# Patient Record
Sex: Female | Born: 1955
Health system: Southern US, Community
[De-identification: ages and names within clinical notes are randomized; demographics above are authoritative.]

## PROBLEM LIST (undated history)

## (undated) DIAGNOSIS — M1990 Unspecified osteoarthritis, unspecified site: Secondary | ICD-10-CM

## (undated) DIAGNOSIS — F329 Major depressive disorder, single episode, unspecified: Secondary | ICD-10-CM

## (undated) DIAGNOSIS — F32A Depression, unspecified: Secondary | ICD-10-CM

## (undated) DIAGNOSIS — B019 Varicella without complication: Secondary | ICD-10-CM

## (undated) DIAGNOSIS — E079 Disorder of thyroid, unspecified: Secondary | ICD-10-CM

## (undated) DIAGNOSIS — N390 Urinary tract infection, site not specified: Secondary | ICD-10-CM

## (undated) DIAGNOSIS — K589 Irritable bowel syndrome without diarrhea: Secondary | ICD-10-CM

## (undated) DIAGNOSIS — G459 Transient cerebral ischemic attack, unspecified: Secondary | ICD-10-CM

## (undated) DIAGNOSIS — J42 Unspecified chronic bronchitis: Secondary | ICD-10-CM

## (undated) DIAGNOSIS — K219 Gastro-esophageal reflux disease without esophagitis: Secondary | ICD-10-CM

## (undated) HISTORY — PX: ABDOMINAL HYSTERECTOMY: SHX81

## (undated) HISTORY — DX: Irritable bowel syndrome, unspecified: K58.9

## (undated) HISTORY — DX: Depression, unspecified: F32.A

## (undated) HISTORY — DX: Gastro-esophageal reflux disease without esophagitis: K21.9

## (undated) HISTORY — DX: Unspecified osteoarthritis, unspecified site: M19.90

## (undated) HISTORY — DX: Urinary tract infection, site not specified: N39.0

## (undated) HISTORY — DX: Major depressive disorder, single episode, unspecified: F32.9

## (undated) HISTORY — DX: Unspecified chronic bronchitis: J42

## (undated) HISTORY — DX: Disorder of thyroid, unspecified: E07.9

## (undated) HISTORY — DX: Transient cerebral ischemic attack, unspecified: G45.9

## (undated) HISTORY — DX: Varicella without complication: B01.9

## (undated) HISTORY — PX: APPENDECTOMY: SHX54

---

## 1990-02-19 HISTORY — PX: BREAST BIOPSY: SHX20

## 2005-03-07 ENCOUNTER — Ambulatory Visit: Payer: Self-pay

## 2013-12-23 ENCOUNTER — Emergency Department: Payer: Self-pay | Admitting: Emergency Medicine

## 2013-12-23 LAB — COMPREHENSIVE METABOLIC PANEL
ALT: 20 U/L
Albumin: 4.3 g/dL (ref 3.4–5.0)
Alkaline Phosphatase: 100 U/L
Anion Gap: 11 (ref 7–16)
BUN: 20 mg/dL — ABNORMAL HIGH (ref 7–18)
Bilirubin,Total: 0.5 mg/dL (ref 0.2–1.0)
CHLORIDE: 103 mmol/L (ref 98–107)
CREATININE: 0.79 mg/dL (ref 0.60–1.30)
Calcium, Total: 9.5 mg/dL (ref 8.5–10.1)
Co2: 25 mmol/L (ref 21–32)
EGFR (African American): >60
EGFR (Non-African Amer.): >60
GLUCOSE: 96 mg/dL (ref 65–99)
OSMOLALITY: 280 (ref 275–301)
Potassium: 3.3 mmol/L — ABNORMAL LOW (ref 3.5–5.1)
SGOT(AST): 30 U/L (ref 15–37)
Sodium: 139 mmol/L (ref 136–145)
TOTAL PROTEIN: 8.7 g/dL — AB (ref 6.4–8.2)

## 2013-12-23 LAB — CBC
HCT: 44.7 % (ref 35.0–47.0)
HGB: 14.8 g/dL (ref 12.0–16.0)
MCH: 29.5 pg (ref 26.0–34.0)
MCHC: 33 g/dL (ref 32.0–36.0)
MCV: 89 fL (ref 80–100)
Platelet: 477 10*3/uL — ABNORMAL HIGH (ref 150–440)
RBC: 5.01 10*6/uL (ref 3.80–5.20)
RDW: 14.3 % (ref 11.5–14.5)
WBC: 16.1 10*3/uL — ABNORMAL HIGH (ref 3.6–11.0)

## 2013-12-23 LAB — URINALYSIS, COMPLETE
BILIRUBIN, UR: NEGATIVE
Glucose,UR: NEGATIVE mg/dL (ref 0–75)
Leukocyte Esterase: NEGATIVE
NITRITE: NEGATIVE
PH: 5 (ref 4.5–8.0)
PROTEIN: NEGATIVE
SPECIFIC GRAVITY: 1.024 (ref 1.003–1.030)
Squamous Epithelial: 3
WBC UR: 4 /HPF (ref 0–5)

## 2013-12-23 LAB — TROPONIN I: Troponin-I: <0.02 ng/mL

## 2013-12-25 ENCOUNTER — Emergency Department: Payer: Self-pay | Admitting: Emergency Medicine

## 2013-12-25 LAB — COMPREHENSIVE METABOLIC PANEL
ALBUMIN: 4.1 g/dL (ref 3.4–5.0)
ALK PHOS: 88 U/L
AST: 29 U/L (ref 15–37)
Anion Gap: 10 (ref 7–16)
BILIRUBIN TOTAL: 0.4 mg/dL (ref 0.2–1.0)
BUN: 7 mg/dL (ref 7–18)
Calcium, Total: 8.9 mg/dL (ref 8.5–10.1)
Chloride: 101 mmol/L (ref 98–107)
Co2: 28 mmol/L (ref 21–32)
Creatinine: 0.74 mg/dL (ref 0.60–1.30)
EGFR (Non-African Amer.): >60
Glucose: 89 mg/dL (ref 65–99)
OSMOLALITY: 275 (ref 275–301)
Potassium: 3.2 mmol/L — ABNORMAL LOW (ref 3.5–5.1)
SGPT (ALT): 21 U/L
SODIUM: 139 mmol/L (ref 136–145)
Total Protein: 8 g/dL (ref 6.4–8.2)

## 2013-12-25 LAB — DRUG SCREEN, URINE
Amphetamines, Ur Screen: NEGATIVE (ref ?–1000)
Barbiturates, Ur Screen: NEGATIVE (ref ?–200)
Benzodiazepine, Ur Scrn: NEGATIVE (ref ?–200)
COCAINE METABOLITE, UR ~~LOC~~: NEGATIVE (ref ?–300)
Cannabinoid 50 Ng, Ur ~~LOC~~: NEGATIVE (ref ?–50)
MDMA (ECSTASY) UR SCREEN: NEGATIVE (ref ?–500)
Methadone, Ur Screen: NEGATIVE (ref ?–300)
Opiate, Ur Screen: POSITIVE (ref ?–300)
Phencyclidine (PCP) Ur S: NEGATIVE (ref ?–25)
TRICYCLIC, UR SCREEN: POSITIVE (ref ?–1000)

## 2013-12-25 LAB — URINALYSIS, COMPLETE
BACTERIA: NONE SEEN
Bilirubin,UR: NEGATIVE
Glucose,UR: NEGATIVE mg/dL (ref 0–75)
Hyaline Cast: 6
LEUKOCYTE ESTERASE: NEGATIVE
Nitrite: NEGATIVE
PH: 5 (ref 4.5–8.0)
PROTEIN: NEGATIVE
Specific Gravity: 1.017 (ref 1.003–1.030)

## 2013-12-25 LAB — CBC WITH DIFFERENTIAL/PLATELET
BASOS ABS: 0.1 10*3/uL (ref 0.0–0.1)
Basophil %: 0.9 %
EOS PCT: 0.8 %
Eosinophil #: 0.1 10*3/uL (ref 0.0–0.7)
HCT: 40.2 % (ref 35.0–47.0)
HGB: 13.2 g/dL (ref 12.0–16.0)
Lymphocyte #: 3.5 10*3/uL (ref 1.0–3.6)
Lymphocyte %: 32.9 %
MCH: 30 pg (ref 26.0–34.0)
MCHC: 32.8 g/dL (ref 32.0–36.0)
MCV: 91 fL (ref 80–100)
MONO ABS: 0.7 x10 3/mm (ref 0.2–0.9)
MONOS PCT: 6.7 %
Neutrophil #: 6.2 10*3/uL (ref 1.4–6.5)
Neutrophil %: 58.7 %
Platelet: 452 10*3/uL — ABNORMAL HIGH (ref 150–440)
RBC: 4.4 10*6/uL (ref 3.80–5.20)
RDW: 14.4 % (ref 11.5–14.5)
WBC: 10.6 10*3/uL (ref 3.6–11.0)

## 2013-12-25 LAB — AMMONIA: Ammonia, Plasma: <10 mcmol/L (ref 11–32)

## 2013-12-25 LAB — ACETAMINOPHEN LEVEL: Acetaminophen: <2 ug/mL

## 2013-12-25 LAB — TSH: Thyroid Stimulating Horm: 1.04 u[IU]/mL

## 2013-12-25 LAB — SALICYLATE LEVEL: Salicylates, Serum: 4.5 mg/dL — ABNORMAL HIGH

## 2013-12-25 LAB — LIPASE, BLOOD: Lipase: 111 U/L (ref 73–393)

## 2013-12-28 LAB — CULTURE, BLOOD (SINGLE)

## 2014-04-05 ENCOUNTER — Emergency Department: Payer: Self-pay | Admitting: Emergency Medicine

## 2016-01-01 IMAGING — CT CT HEAD WITHOUT CONTRAST
1 series · 16 of 30 positions shown, 20 images · non-contrast
Comparison: None.

CLINICAL DATA: Altered mental status on going for 4 days.

EXAM:
CT HEAD WITHOUT CONTRAST
TECHNIQUE: Contiguous axial images were obtained from the base of the skull
through the vertex without intravenous contrast.

[Series 2: head wo · axial · 0.40mm/px · z∈[-74,+66]mm · 16 of 30 slices shown, 20 images]
[im 2/30  brain]
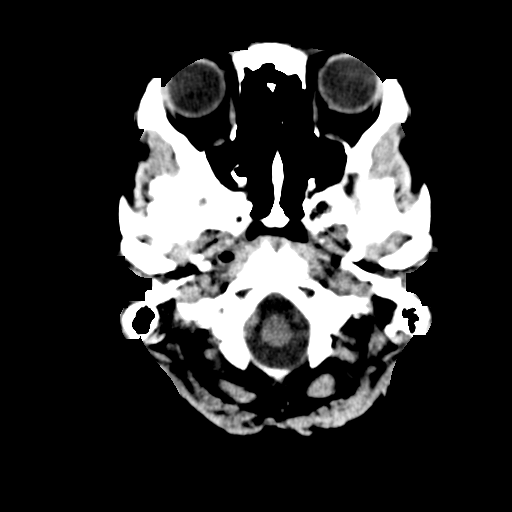
[im 2/30  bone]
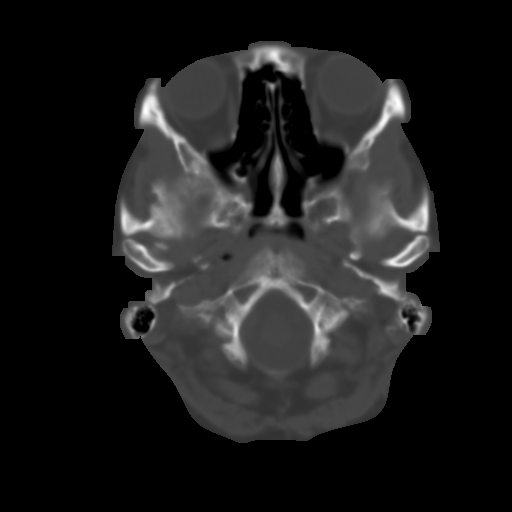
[im 4/30  brain]
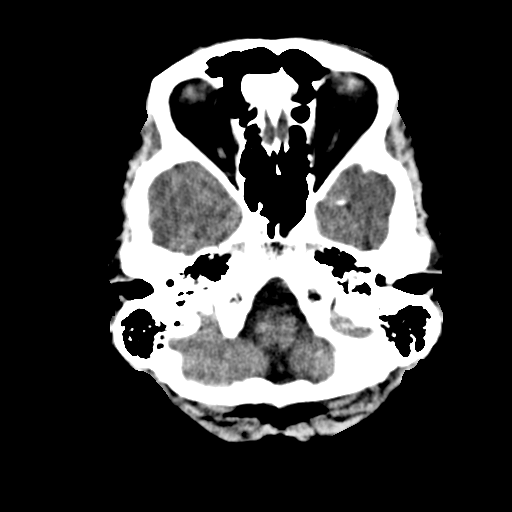
[im 6/30  brain]
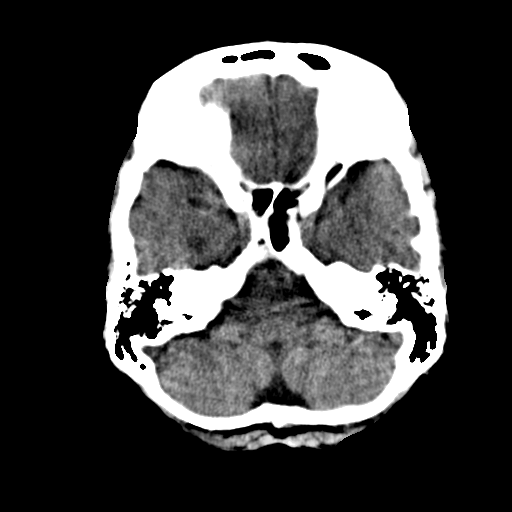
[im 8/30  brain]
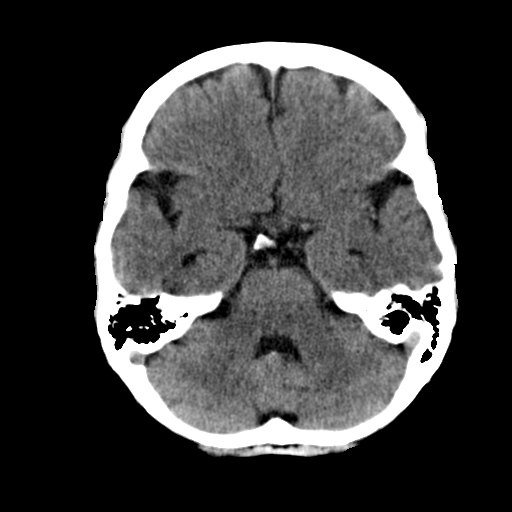
[im 9/30  brain]
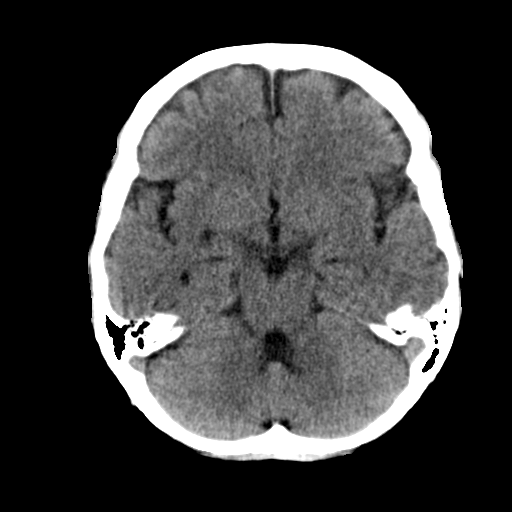
[im 9/30  bone]
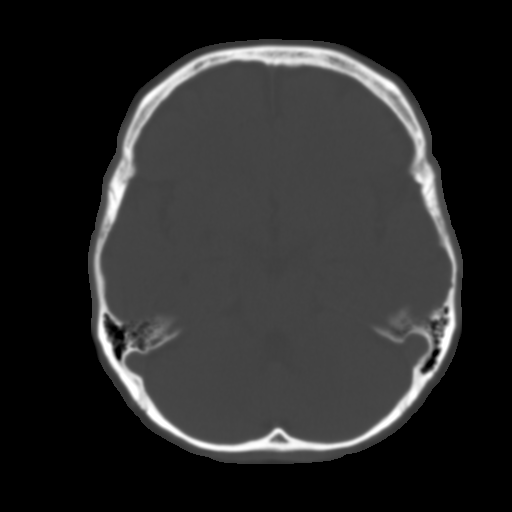
[im 11/30  brain]
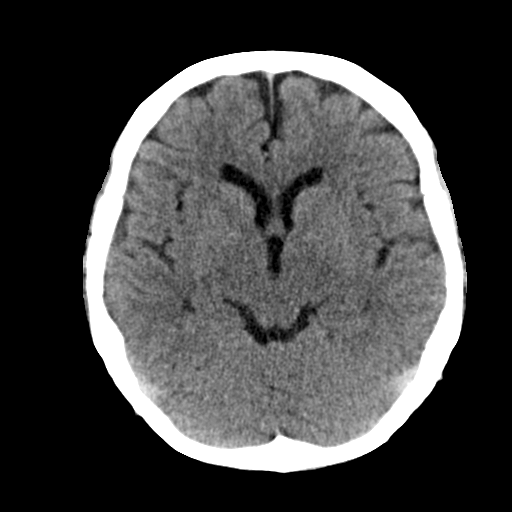
[im 13/30  brain]
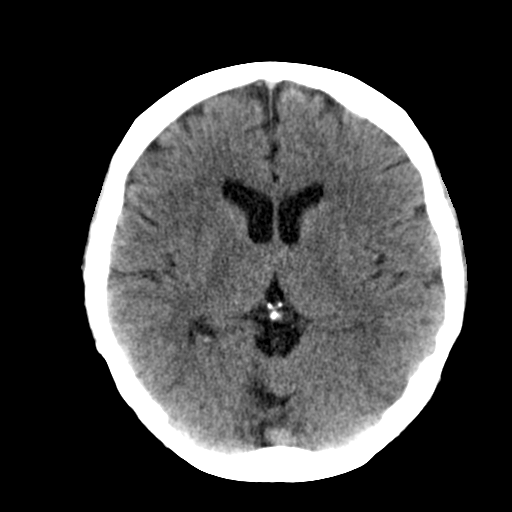
[im 15/30  brain]
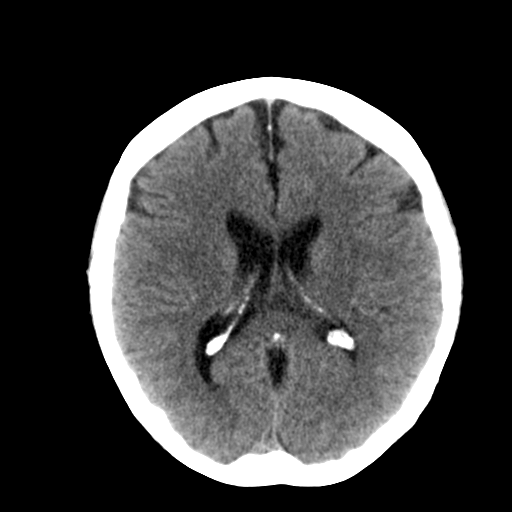
[im 16/30  brain]
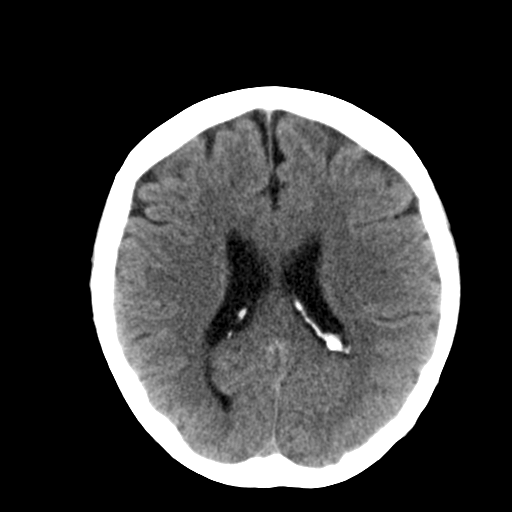
[im 16/30  bone]
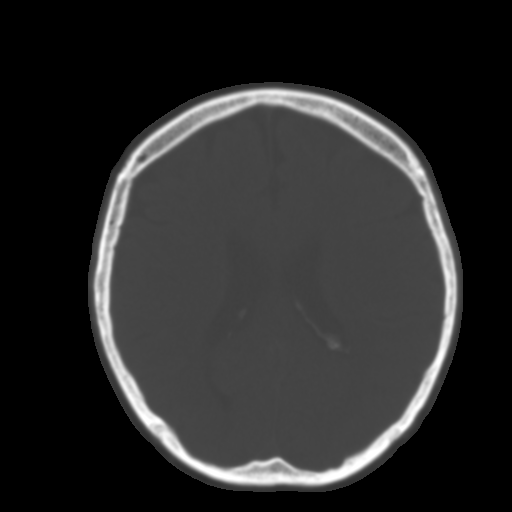
[im 18/30  brain]
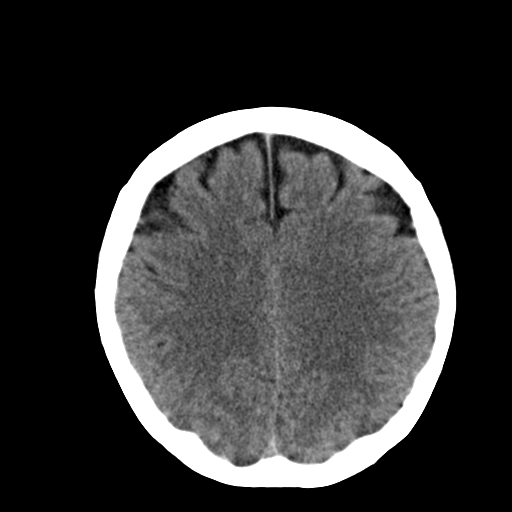
[im 20/30  brain]
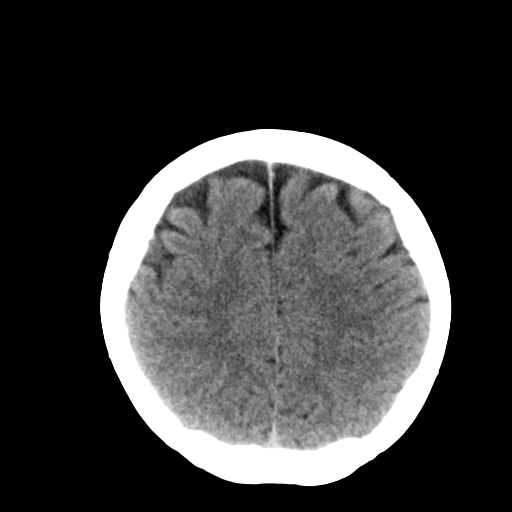
[im 22/30  brain]
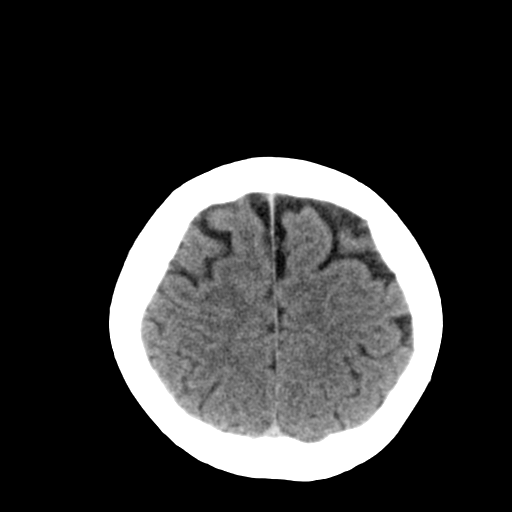
[im 23/30  brain]
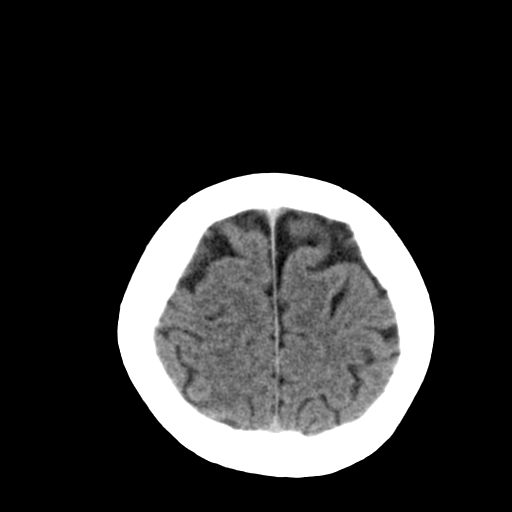
[im 23/30  bone]
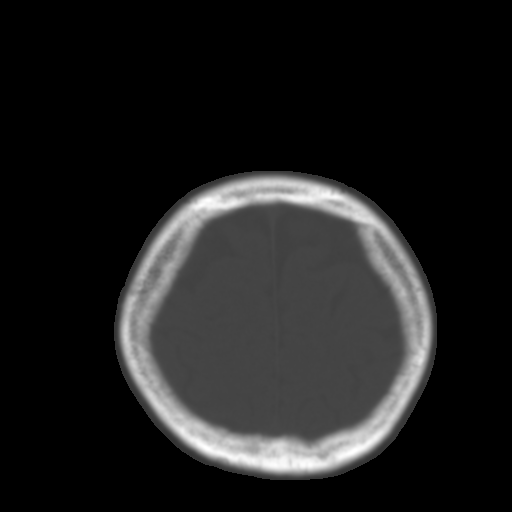
[im 25/30  brain]
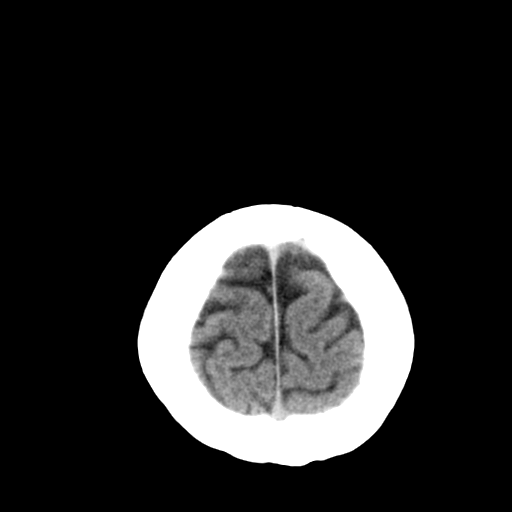
[im 27/30  brain]
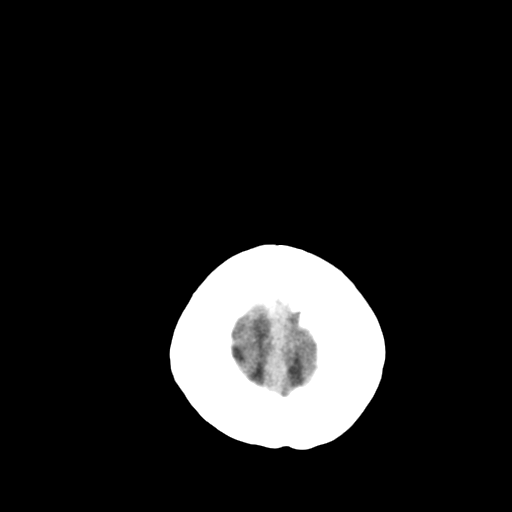
[im 29/30  brain]
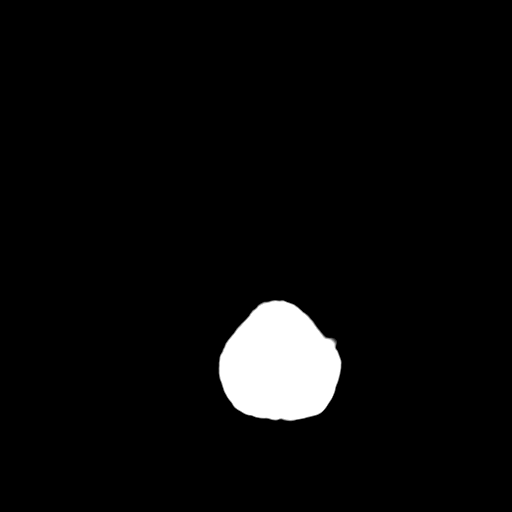

[16 of 30 positions shown; findings below may reference images not displayed]

FINDINGS: No acute intracranial hemorrhage. No focal mass lesion. No CT
evidence of acute infarction. No midline shift or mass effect. No
hydrocephalus. Basilar cisterns are patent. Paranasal sinuses and
mastoid air cells are clear.
IMPRESSION: No acute intracranial findings.

## 2016-04-13 IMAGING — CR DG CHEST 1V PORT
1 series · 1 of 1 positions shown · non-contrast
Comparison: 12/25/2013

CLINICAL DATA: Altered mental status

EXAM:
PORTABLE CHEST - 1 VIEW

[ap]
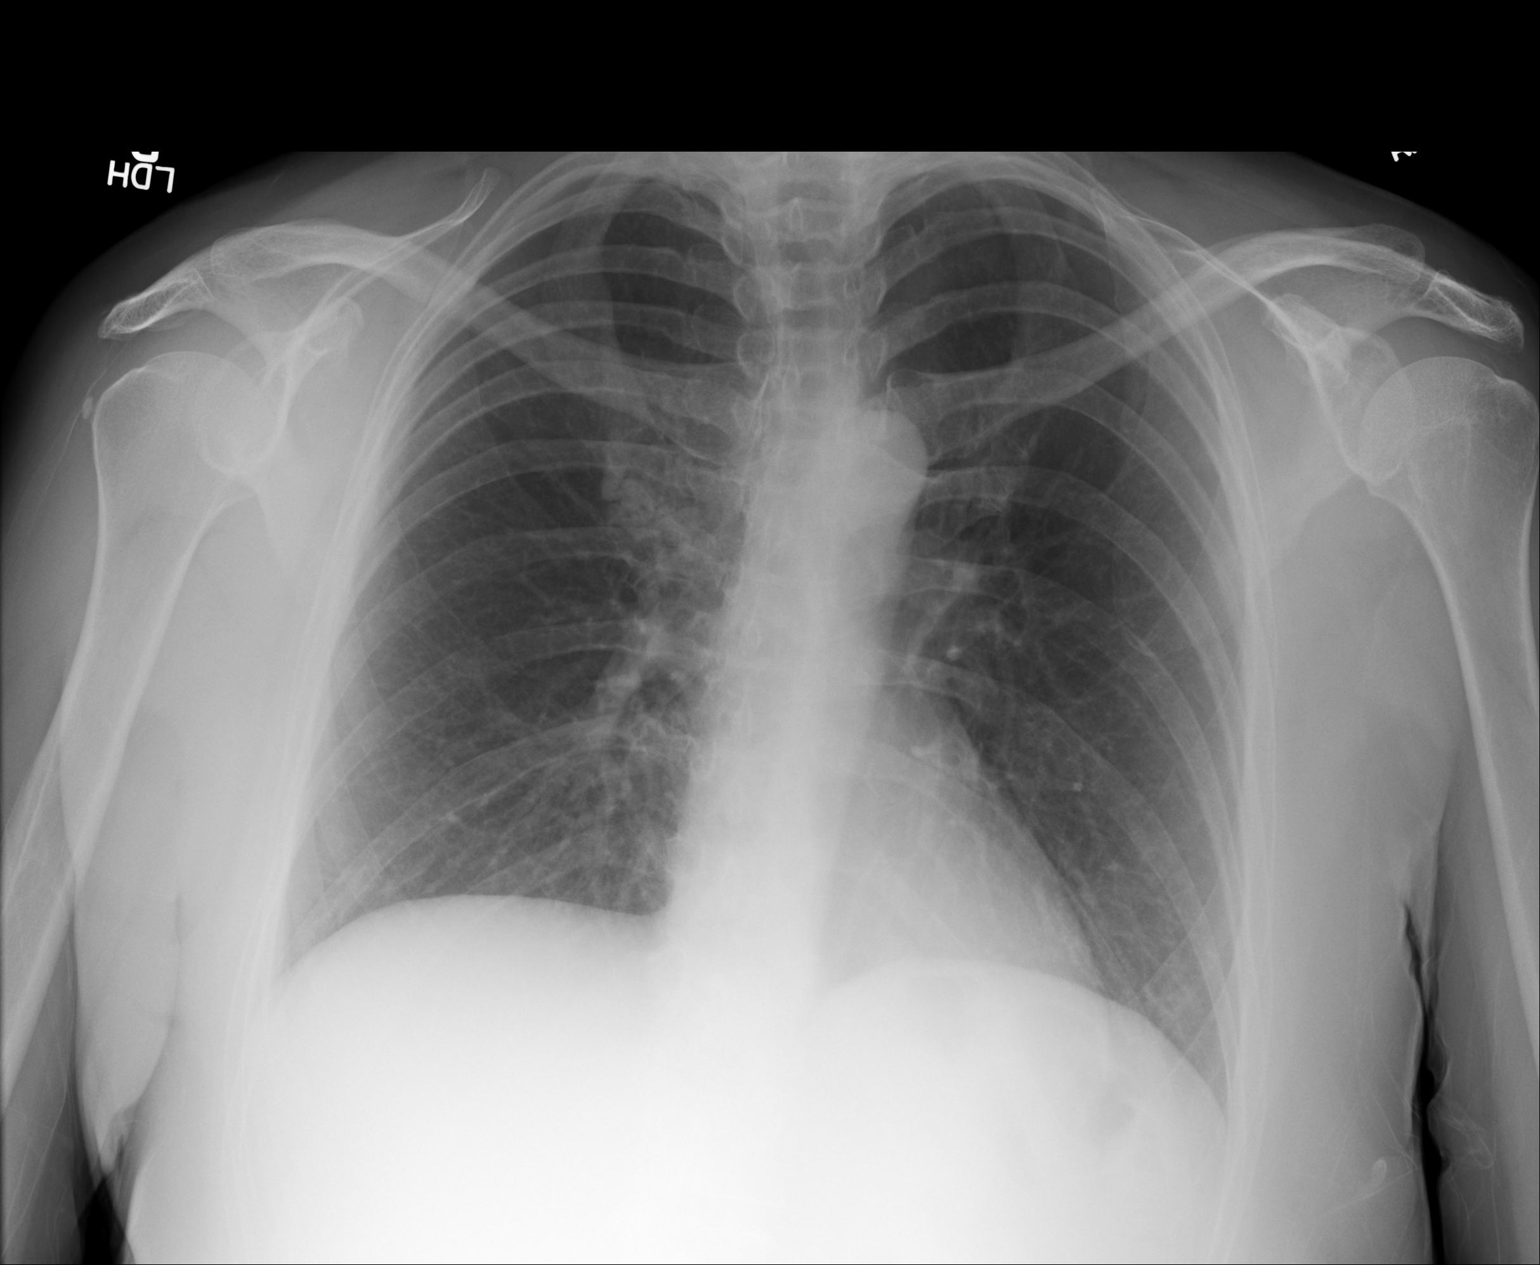

[1 of 1 positions shown; findings below may reference images not displayed]

FINDINGS: Normal heart size and mediastinal contours. No acute infiltrate or
edema. No effusion or pneumothorax. Incidental calcific tendinitis
of the right rotator cuff. No osseous findings to explain acute
chest pain.
IMPRESSION: No active disease.

## 2017-05-03 ENCOUNTER — Encounter: Payer: Self-pay | Admitting: Family Medicine

## 2017-06-03 ENCOUNTER — Ambulatory Visit: Payer: Self-pay | Admitting: Primary Care

## 2017-07-01 ENCOUNTER — Encounter: Payer: Self-pay | Admitting: Primary Care

## 2017-07-01 ENCOUNTER — Ambulatory Visit (INDEPENDENT_AMBULATORY_CARE_PROVIDER_SITE_OTHER): Payer: BLUE CROSS/BLUE SHIELD | Admitting: Primary Care

## 2017-07-01 VITALS — BP 102/62 | HR 74 | Temp 98.4°F | Ht 65.5 in | Wt 110.2 lb

## 2017-07-01 DIAGNOSIS — F419 Anxiety disorder, unspecified: Secondary | ICD-10-CM | POA: Diagnosis not present

## 2017-07-01 DIAGNOSIS — E039 Hypothyroidism, unspecified: Secondary | ICD-10-CM | POA: Insufficient documentation

## 2017-07-01 DIAGNOSIS — M545 Low back pain: Secondary | ICD-10-CM

## 2017-07-01 DIAGNOSIS — E785 Hyperlipidemia, unspecified: Secondary | ICD-10-CM | POA: Insufficient documentation

## 2017-07-01 DIAGNOSIS — M150 Primary generalized (osteo)arthritis: Secondary | ICD-10-CM | POA: Diagnosis not present

## 2017-07-01 DIAGNOSIS — G8929 Other chronic pain: Secondary | ICD-10-CM | POA: Diagnosis not present

## 2017-07-01 DIAGNOSIS — F329 Major depressive disorder, single episode, unspecified: Secondary | ICD-10-CM

## 2017-07-01 DIAGNOSIS — M549 Dorsalgia, unspecified: Secondary | ICD-10-CM

## 2017-07-01 DIAGNOSIS — F32A Depression, unspecified: Secondary | ICD-10-CM | POA: Insufficient documentation

## 2017-07-01 DIAGNOSIS — M159 Polyosteoarthritis, unspecified: Secondary | ICD-10-CM

## 2017-07-01 DIAGNOSIS — M1990 Unspecified osteoarthritis, unspecified site: Secondary | ICD-10-CM | POA: Insufficient documentation

## 2017-07-01 LAB — LIPID PANEL
CHOL/HDL RATIO: 4
Cholesterol: 255 mg/dL — ABNORMAL HIGH (ref 0–200)
HDL: 66.8 mg/dL (ref >39.00)
LDL Cholesterol: 158 mg/dL — ABNORMAL HIGH (ref 0–99)
NONHDL: 188.35
Triglycerides: 151 mg/dL — ABNORMAL HIGH (ref 0.0–149.0)
VLDL: 30.2 mg/dL (ref 0.0–40.0)

## 2017-07-01 LAB — COMPREHENSIVE METABOLIC PANEL
ALK PHOS: 64 U/L (ref 39–117)
ALT: 16 U/L (ref 0–35)
AST: 21 U/L (ref 0–37)
Albumin: 4.9 g/dL (ref 3.5–5.2)
BILIRUBIN TOTAL: 0.3 mg/dL (ref 0.2–1.2)
BUN: 17 mg/dL (ref 6–23)
CO2: 33 meq/L — AB (ref 19–32)
CREATININE: 0.93 mg/dL (ref 0.40–1.20)
Calcium: 10 mg/dL (ref 8.4–10.5)
Chloride: 97 mEq/L (ref 96–112)
GFR: 65.1 mL/min (ref >60.00)
GLUCOSE: 96 mg/dL (ref 70–99)
Potassium: 4.1 mEq/L (ref 3.5–5.1)
SODIUM: 136 meq/L (ref 135–145)
TOTAL PROTEIN: 8 g/dL (ref 6.0–8.3)

## 2017-07-01 NOTE — Assessment & Plan Note (Signed)
Initially with hyperthyroidism in early 1990's, treated with levothyroxine since. No levothyroxine since 2015. TSH pending today.

## 2017-07-01 NOTE — Progress Notes (Signed)
Subjective:    Patient ID: Melinda Mathis, female    DOB: 30-Sep-1955, 62 y.o.   MRN: [id number]  HPI  Melinda Mathis is a 62 year old female who presents today to establish care and discuss the problems mentioned below. Will obtain old records.  1) Depression/Anxiety: Long term history of and was once prescribed Clonazepam for which she took for years. She still takes Clonazepam daily "without a prescription" and is taking 0.5 mg once daily. She was once managed on Zoloft in the past, believes she did well. PHQ 9 score of 2 GAD 7 score of 2 today. She denies SI/HI.   2) GERD/IBS Diarrhea Type: Intermittent GERD and will take Tums once weekly on average. Symptoms of epigastric pain and esophageal reflux mostly. Intermittent IBS symptoms with diarrhea, overall not problematic.   3) Hyperlipidemia/TIA: Endorses a history of TIA around 2014/2015 when she was taken off of her Clonazepam "too fast". History of elevated triglycerides. Was once managed on Lipitor for which she stopped taking 2-3 years ago.   4) Hyperthyroidism: Treated with radioactive iodine in early 1990's. Was once managed on levothyroxine, thinks she was once managed on 25 mcg. She's been without medication for the last 2-3 years.   5) Osteoarthritis/Chronic Back Pain: Arthritis located to the entire body, chronic back pain to lumbar spine mostly. She's struggled with chronic pain for years, currently not taking anything Rx for pain. She "deals" with her pain daily.   Review of Systems  Respiratory: Negative for shortness of breath.   Cardiovascular: Negative for chest pain.  Gastrointestinal:       GERD  Musculoskeletal: Positive for arthralgias and back pain.  Psychiatric/Behavioral:       See HPI       Past Medical History:  Diagnosis Date  . Arthritis   . Chickenpox   . Depression   . GERD (gastroesophageal reflux disease)   . IBS (irritable bowel syndrome)   . Thyroid disease   . TIA (transient ischemic attack)    . UTI (urinary tract infection)      Social History   Socioeconomic History  . Marital status: Single    Spouse name: Not on file  . Number of children: Not on file  . Years of education: Not on file  . Highest education level: Not on file  Occupational History  . Not on file  Social Needs  . Financial resource strain: Not on file  . Food insecurity:    Worry: Not on file    Inability: Not on file  . Transportation needs:    Medical: Not on file    Non-medical: Not on file  Tobacco Use  . Smoking status: Current Every Day Smoker  . Smokeless tobacco: Never Used  Substance and Sexual Activity  . Alcohol use: Never    Frequency: Never  . Drug use: Not on file  . Sexual activity: Not on file  Lifestyle  . Physical activity:    Days per week: Not on file    Minutes per session: Not on file  . Stress: Not on file  Relationships  . Social connections:    Talks on phone: Not on file    Gets together: Not on file    Attends religious service: Not on file    Active member of club or organization: Not on file    Attends meetings of clubs or organizations: Not on file    Relationship status: Not on file  . Intimate  partner violence:    Fear of current or ex partner: Not on file    Emotionally abused: Not on file    Physically abused: Not on file    Forced sexual activity: Not on file  Other Topics Concern  . Not on file  Social History Narrative   Divorced.    No children.   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    Enjoys reading, spending time outdoors.     Past Surgical History:  Procedure Laterality Date  . ABDOMINAL HYSTERECTOMY    . APPENDECTOMY    . BREAST BIOPSY  1992    Family History  Problem Relation Age of Onset  . Arthritis Mother   . Cancer Mother        lung  . COPD Mother   . Depression Mother   . Hyperlipidemia Mother   . Hypertension Mother   . Arthritis Father     No Known Allergies  No current outpatient medications on file prior  to visit.   No current facility-administered medications on file prior to visit.     BP 102/62   Pulse 74   Temp 98.4 F (36.9 C) (Oral)   Ht 5' 5.5" (1.664 m)   Wt 110 lb 4 oz (50 kg)   SpO2 94%   BMI 18.07 kg/m    Objective:   Physical Exam  Constitutional: She appears well-nourished.  Neck: Neck supple.  Cardiovascular: Normal rate and regular rhythm.  Pulmonary/Chest: Effort normal and breath sounds normal.  Skin: Skin is warm and dry.  Psychiatric: She has a normal mood and affect.          Assessment & Plan:

## 2017-07-01 NOTE — Assessment & Plan Note (Signed)
Present for years, located to "entire body". Consider PT vs pain management referral if warranted. Patient will update.

## 2017-07-01 NOTE — Patient Instructions (Signed)
I recommend you wean off of your Clonazepam as discussed. Start by taking 1/2 tablet daily for 2-4 weeks, then 1/2 tablet every other day for 2-4 weeks then stop.   Stop by the lab prior to leaving today. I will notify you of your results once received.   It was a pleasure to meet you today! Please don't hesitate to call or message me with any questions. Welcome to [facility]!

## 2017-07-01 NOTE — Assessment & Plan Note (Signed)
Chronic for years, manages daily. Consider PT vs pain management referral next visit.

## 2017-07-01 NOTE — Assessment & Plan Note (Signed)
Chronic for years, taking Clonazepam 0.5 mg daily for which she does not get as a prescription. Encouraged her to start weaning off of Clonazepam, discussed potential long term effects of chronic benzo use. Instructions provided regarding weaning.  PHQ 9 score of 2 and GAD 7 score of 2 today. Consider adding Zoloft if she decides to wean off of Clonazepam. Discussed that I will not prescribe Clonazepam.

## 2017-07-01 NOTE — Assessment & Plan Note (Signed)
Also endorses history of "TIA" in 2015. Check lipids today. Also recommended tobacco cessation.

## 2017-07-02 LAB — TSH: TSH: 39.29 u[IU]/mL — AB (ref 0.35–4.50)

## 2017-07-03 ENCOUNTER — Other Ambulatory Visit: Payer: Self-pay | Admitting: Primary Care

## 2017-07-03 DIAGNOSIS — E782 Mixed hyperlipidemia: Secondary | ICD-10-CM

## 2017-07-03 DIAGNOSIS — E039 Hypothyroidism, unspecified: Secondary | ICD-10-CM

## 2017-07-03 MED ORDER — ATORVASTATIN CALCIUM 20 MG PO TABS: ORAL_TABLET | ORAL | 3 refills | Status: DC

## 2017-07-03 MED ORDER — LEVOTHYROXINE SODIUM 25 MCG PO TABS: ORAL_TABLET | ORAL | 1 refills | Status: DC

## 2017-08-15 ENCOUNTER — Other Ambulatory Visit: Payer: BLUE CROSS/BLUE SHIELD

## 2017-08-21 ENCOUNTER — Other Ambulatory Visit: Payer: BLUE CROSS/BLUE SHIELD

## 2017-09-02 ENCOUNTER — Other Ambulatory Visit: Payer: Self-pay | Admitting: Primary Care

## 2017-09-02 DIAGNOSIS — E039 Hypothyroidism, unspecified: Secondary | ICD-10-CM

## 2017-09-18 ENCOUNTER — Other Ambulatory Visit: Payer: BLUE CROSS/BLUE SHIELD

## 2017-09-19 ENCOUNTER — Other Ambulatory Visit: Payer: BLUE CROSS/BLUE SHIELD

## 2017-10-14 ENCOUNTER — Other Ambulatory Visit: Payer: Self-pay | Admitting: Primary Care

## 2017-10-14 DIAGNOSIS — E039 Hypothyroidism, unspecified: Secondary | ICD-10-CM

## 2017-10-15 NOTE — Telephone Encounter (Signed)
Last prescribed on 09/02/2017. Last office visit on 07/01/2017.  Patient kept canceling her lab appt. Please advise.

## 2017-10-15 NOTE — Telephone Encounter (Signed)
Please notify patient that she needs to now come in our office and have a visit with me. If she fails to do so then we will no longer be able to provide her refills of her medications. We need to see her within one month.

## 2017-10-17 NOTE — Telephone Encounter (Signed)
Per DPR, left detail message of Graylon GunningKate Clark's comments for patient to call back to schedule an OV.

## 2017-11-07 NOTE — Telephone Encounter (Signed)
Sending letter with results and Kate Clark's comments for patient.  

## 2017-11-07 NOTE — Telephone Encounter (Signed)
Per DPR, left detail message of Kate Clark's comments for patient to call back 

## 2017-12-09 ENCOUNTER — Other Ambulatory Visit: Payer: Self-pay | Admitting: Primary Care

## 2017-12-09 DIAGNOSIS — E039 Hypothyroidism, unspecified: Secondary | ICD-10-CM

## 2018-02-26 ENCOUNTER — Other Ambulatory Visit: Payer: Self-pay | Admitting: Primary Care

## 2018-02-26 DIAGNOSIS — E039 Hypothyroidism, unspecified: Secondary | ICD-10-CM

## 2018-02-27 ENCOUNTER — Other Ambulatory Visit: Payer: Self-pay | Admitting: Primary Care

## 2018-02-27 ENCOUNTER — Telehealth: Payer: Self-pay | Admitting: Primary Care

## 2018-02-27 DIAGNOSIS — E039 Hypothyroidism, unspecified: Secondary | ICD-10-CM

## 2018-02-27 MED ORDER — LEVOTHYROXINE SODIUM 25 MCG PO TABS: ORAL_TABLET | ORAL | 0 refills | Status: DC

## 2018-02-27 NOTE — Telephone Encounter (Signed)
Tried to call pt. No answer and No VM. 

## 2018-02-27 NOTE — Telephone Encounter (Signed)
Please call patient:  We received another refill request for her levothyroxine 25 mcg tablets.  We requested back in May 2019 that she return in 6 weeks later for follow-up of her thyroid function.  She will need to follow-up in our office immediately for further refills of her levothyroxine.  I will send the final 30-day supply to her pharmacy until she is seen.

## 2018-02-28 NOTE — Telephone Encounter (Signed)
Tried to call patient this morning and this afternoon, per message the phone number is not accepting calls at this time.

## 2018-03-04 NOTE — Telephone Encounter (Signed)
Letter sent to pt

## 2018-04-04 ENCOUNTER — Ambulatory Visit: Payer: BLUE CROSS/BLUE SHIELD | Admitting: Primary Care

## 2018-05-02 ENCOUNTER — Other Ambulatory Visit: Payer: Self-pay | Admitting: Primary Care

## 2018-05-02 DIAGNOSIS — E782 Mixed hyperlipidemia: Secondary | ICD-10-CM

## 2018-05-02 DIAGNOSIS — E039 Hypothyroidism, unspecified: Secondary | ICD-10-CM

## 2018-05-02 NOTE — Telephone Encounter (Signed)
Please call patient and tell her that I can no longer refill her thyroid medication if she does not come in for follow-up.  We need to get her in next week if possible, let me know what she decides.

## 2018-05-02 NOTE — Telephone Encounter (Signed)
Last prescribed on 02/27/2018 . Last office visit on 07/01/2017. No future appointment. Patient has been canceling her lab and OV appointments

## 2018-05-06 MED ORDER — ATORVASTATIN CALCIUM 20 MG PO TABS: ORAL_TABLET | ORAL | 0 refills | Status: DC

## 2018-05-06 NOTE — Telephone Encounter (Signed)
Spoken and notified patient of Melinda Mathis's comments. Patient verbalized understanding.  

## 2018-05-06 NOTE — Telephone Encounter (Signed)
I will send in a 2 month supply of levothyroxine. Have her take with water only on an empty stomach, no food or other medications for 30 minutes. No vitamins, heartburn medicine for 4 hours. She WILL need to be see in 6 weeks for continuation of this medication, please schedule.

## 2018-05-06 NOTE — Telephone Encounter (Signed)
Spoken and notified patient of Melinda Mathis comments. Patient has schedule OV on 05/14/2018

## 2018-05-06 NOTE — Telephone Encounter (Signed)
Patient called back and stated that she have not taken any her medication for over a month. Should she still keep her appointment?

## 2018-05-14 ENCOUNTER — Ambulatory Visit: Payer: BLUE CROSS/BLUE SHIELD | Admitting: Primary Care

## 2018-06-09 ENCOUNTER — Ambulatory Visit: Payer: BLUE CROSS/BLUE SHIELD | Admitting: Primary Care

## 2018-07-28 ENCOUNTER — Other Ambulatory Visit: Payer: Self-pay | Admitting: Primary Care

## 2018-07-28 DIAGNOSIS — E039 Hypothyroidism, unspecified: Secondary | ICD-10-CM

## 2018-08-11 ENCOUNTER — Encounter: Payer: Self-pay | Admitting: Primary Care

## 2018-08-11 ENCOUNTER — Ambulatory Visit (INDEPENDENT_AMBULATORY_CARE_PROVIDER_SITE_OTHER): Payer: BC Managed Care – PPO | Admitting: Primary Care

## 2018-08-11 DIAGNOSIS — M545 Low back pain: Secondary | ICD-10-CM

## 2018-08-11 DIAGNOSIS — G8929 Other chronic pain: Secondary | ICD-10-CM | POA: Diagnosis not present

## 2018-08-11 DIAGNOSIS — E785 Hyperlipidemia, unspecified: Secondary | ICD-10-CM

## 2018-08-11 DIAGNOSIS — E039 Hypothyroidism, unspecified: Secondary | ICD-10-CM

## 2018-08-11 NOTE — Assessment & Plan Note (Signed)
No recent TSH on file. Our staff has been refilling her levothyroxine over the last one year without a recent TSH despite recommendations. It does appear that she did not return for scheduled labs in June 2019.  Repeat TSH pending. Discussed to separate levothyroxine from atorvastatin. Await results.

## 2018-08-11 NOTE — Assessment & Plan Note (Signed)
Compliant to atorvastatin.  Repeat lipids pending. 

## 2018-08-11 NOTE — Progress Notes (Signed)
Subjective:    Patient ID: Melinda Mathis, female    DOB: 06/09/1955, 63 y.o.   MRN: [id number]  HPI     Melinda Mathis - 63 y.o. female  MRN [id number]  Date of Birth: 08/07/1955  PCP: Doreene Nestlark,  K, NP  This service was provided via telemedicine. Phone Visit performed on 08/11/2018    Rationale for phone visit along with limitations reviewed. Patient consented to telephone encounter.    Location of patient: Home Location of provider: [facility] [place] [facility] Name of referring provider: N/A   Names of persons and role in encounter: Provider: Doreene Nest K , NP  Patient: Melinda Mathis  Other: N/A   Time on call: 12 min - 0 sec   Subjective: No chief complaint on file.    HPI:  Melinda Mathis is a 63 year old female who presents today for follow up.  1) Hypothyroidism: Originally diagnosed with hyperthyroidism, underwent radioactive iodine in the early 1990's and had been without hormone treatment for the prior several years. Currently managed on levothyroxine 25 mcg tablets which was initiated in May 2019. Her last TSH was 39.29 in May of 2019. She has not returned for follow up since, despite recommendations. It does appear that her levothyroxine has been refilled by our staff.   Since her last visit she's been taking her levothyroxine consistently for the most part for the last one year. She is taking her levothyroxine every morning with water and takes 30 minutes before eating. She does take with her atorvastatin.   2) Hyperlipidemia/TIA: Currently prescribed atorvastatin 20 mg for which she's taking daily. She denies chest pain, shortness of breath, dizziness.    Objective/Observations:   No physical exam or vital signs collected unless specifically identified below.   There were no vitals taken for this visit.   Respiratory status: speaks in complete sentences without evident shortness of breath.   Assessment/Plan:  See problem based  charting.  No problem-specific Assessment & Plan notes found for this encounter.   I discussed the assessment and treatment plan with the patient. The patient was provided an opportunity to ask questions and all were answered. The patient agreed with the plan and demonstrated an understanding of the instructions.  Lab Orders  No laboratory test(s) ordered today    No orders of the defined types were placed in this encounter.   The patient was advised to call back or seek an in-person evaluation if the symptoms worsen or if the condition fails to improve as anticipated.  Doreene Nest K , NP    Review of Systems  Eyes: Negative for visual disturbance.  Respiratory: Negative for shortness of breath.   Cardiovascular: Negative for chest pain.  Allergic/Immunologic: Positive for environmental allergies.  Neurological: Negative for dizziness and headaches.       Past Medical History:  Diagnosis Date  . Arthritis   . Chickenpox   . Depression   . GERD (gastroesophageal reflux disease)   . IBS (irritable bowel syndrome)   . Thyroid disease   . TIA (transient ischemic attack)   . UTI (urinary tract infection)      Social History   Socioeconomic History  . Marital status: Single    Spouse name: Not on file  . Number of children: Not on file  . Years of education: Not on file  . Highest education level: Not on file  Occupational History  . Not on file  Social Needs  . Physicist, medicalinancial resource  strain: Not on file  . Food insecurity    Worry: Not on file    Inability: Not on file  . Transportation needs    Medical: Not on file    Non-medical: Not on file  Tobacco Use  . Smoking status: Current Every Day Smoker  . Smokeless tobacco: Never Used  Substance and Sexual Activity  . Alcohol use: Never    Frequency: Never  . Drug use: Not on file  . Sexual activity: Not on file  Lifestyle  . Physical activity    Days per week: Not on file    Minutes per session: Not on  file  . Stress: Not on file  Relationships  . Social Herbalist on phone: Not on file    Gets together: Not on file    Attends religious service: Not on file    Active member of club or organization: Not on file    Attends meetings of clubs or organizations: Not on file    Relationship status: Not on file  . Intimate partner violence    Fear of current or ex partner: Not on file    Emotionally abused: Not on file    Physically abused: Not on file    Forced sexual activity: Not on file  Other Topics Concern  . Not on file  Social History Narrative   Divorced.    No children.   Retired, once worked in Geologist, engineering.    Enjoys reading, spending time outdoors.     Past Surgical History:  Procedure Laterality Date  . ABDOMINAL HYSTERECTOMY    . APPENDECTOMY    . BREAST BIOPSY  1992    Family History  Problem Relation Age of Onset  . Arthritis Mother   . Cancer Mother        lung  . COPD Mother   . Depression Mother   . Hyperlipidemia Mother   . Hypertension Mother   . Arthritis Father     No Known Allergies  Current Outpatient Medications on File Prior to Visit  Medication Sig Dispense Refill  . atorvastatin (LIPITOR) 20 MG tablet Take 1 tablet by mouth every evening for cholesterol. 30 tablet 0  . levothyroxine (SYNTHROID) 25 MCG tablet TAKE 1 TAB BY MOUTH ONCE DAILY. TAKE ON AN EMPTY STOMACH WITH A GLASSOF WATER ATLEAST 30-60 MINUTES BEFORE BREAKFAST 30 tablet 1   No current facility-administered medications on file prior to visit.     There were no vitals taken for this visit.   Objective:   Physical Exam  Constitutional: She is oriented to person, place, and time.  Respiratory: Effort normal.  Neurological: She is alert and oriented to person, place, and time.  Psychiatric: She has a normal mood and affect.           Assessment & Plan:

## 2018-08-11 NOTE — Patient Instructions (Signed)
Call the main line to schedule your labs as discussed.  Be sure to take your levothyroxine (thyroid medication) every morning on an empty stomach with water only. No food or other medications for 30 minutes. No heartburn medication, iron pills, calcium, vitamin D, or magnesium pills within four hours of taking levothyroxine.   It was a pleasure to see you today!

## 2018-08-11 NOTE — Assessment & Plan Note (Signed)
Chronic, not taking anything OTC. She will schedule a visit for this in the future. Discussed to try OTC Tylenol Arthritis

## 2018-08-27 ENCOUNTER — Other Ambulatory Visit: Payer: Self-pay | Admitting: Primary Care

## 2018-08-27 DIAGNOSIS — E039 Hypothyroidism, unspecified: Secondary | ICD-10-CM

## 2018-08-27 DIAGNOSIS — E782 Mixed hyperlipidemia: Secondary | ICD-10-CM

## 2018-10-13 ENCOUNTER — Other Ambulatory Visit: Payer: Self-pay | Admitting: Primary Care

## 2018-10-13 DIAGNOSIS — E039 Hypothyroidism, unspecified: Secondary | ICD-10-CM

## 2018-11-25 ENCOUNTER — Other Ambulatory Visit: Payer: Self-pay | Admitting: Primary Care

## 2018-11-25 DIAGNOSIS — E039 Hypothyroidism, unspecified: Secondary | ICD-10-CM

## 2019-01-08 ENCOUNTER — Other Ambulatory Visit: Payer: Self-pay | Admitting: Primary Care

## 2019-01-08 DIAGNOSIS — E039 Hypothyroidism, unspecified: Secondary | ICD-10-CM

## 2019-03-10 ENCOUNTER — Other Ambulatory Visit: Payer: Self-pay | Admitting: Primary Care

## 2019-03-10 DIAGNOSIS — E039 Hypothyroidism, unspecified: Secondary | ICD-10-CM

## 2019-03-10 DIAGNOSIS — E782 Mixed hyperlipidemia: Secondary | ICD-10-CM

## 2019-03-11 MED ORDER — ATORVASTATIN CALCIUM 20 MG PO TABS: ORAL_TABLET | ORAL | 0 refills | Status: AC

## 2019-04-20 ENCOUNTER — Other Ambulatory Visit: Payer: Self-pay | Admitting: Primary Care

## 2019-04-20 DIAGNOSIS — E039 Hypothyroidism, unspecified: Secondary | ICD-10-CM

## 2019-04-20 DIAGNOSIS — E782 Mixed hyperlipidemia: Secondary | ICD-10-CM

## 2022-06-07 DIAGNOSIS — M130 Polyarthritis, unspecified: Secondary | ICD-10-CM | POA: Diagnosis not present

## 2022-06-07 DIAGNOSIS — F411 Generalized anxiety disorder: Secondary | ICD-10-CM | POA: Diagnosis not present

## 2022-06-07 DIAGNOSIS — R8290 Unspecified abnormal findings in urine: Secondary | ICD-10-CM | POA: Diagnosis not present

## 2022-06-07 DIAGNOSIS — J42 Unspecified chronic bronchitis: Secondary | ICD-10-CM | POA: Diagnosis not present

## 2022-06-07 DIAGNOSIS — K219 Gastro-esophageal reflux disease without esophagitis: Secondary | ICD-10-CM | POA: Diagnosis not present

## 2022-06-07 DIAGNOSIS — N390 Urinary tract infection, site not specified: Secondary | ICD-10-CM | POA: Diagnosis not present

## 2022-06-07 DIAGNOSIS — E039 Hypothyroidism, unspecified: Secondary | ICD-10-CM | POA: Diagnosis not present

## 2022-06-25 ENCOUNTER — Other Ambulatory Visit (HOSPITAL_COMMUNITY): Payer: Self-pay | Admitting: Internal Medicine

## 2022-06-25 ENCOUNTER — Other Ambulatory Visit (HOSPITAL_COMMUNITY): Payer: Self-pay | Admitting: Gerontology

## 2022-06-25 DIAGNOSIS — K219 Gastro-esophageal reflux disease without esophagitis: Secondary | ICD-10-CM | POA: Diagnosis not present

## 2022-06-25 DIAGNOSIS — Z23 Encounter for immunization: Secondary | ICD-10-CM | POA: Diagnosis not present

## 2022-06-25 DIAGNOSIS — E039 Hypothyroidism, unspecified: Secondary | ICD-10-CM | POA: Diagnosis not present

## 2022-06-25 DIAGNOSIS — F411 Generalized anxiety disorder: Secondary | ICD-10-CM | POA: Diagnosis not present

## 2022-06-25 DIAGNOSIS — J410 Simple chronic bronchitis: Secondary | ICD-10-CM | POA: Diagnosis not present

## 2022-06-25 DIAGNOSIS — Z1231 Encounter for screening mammogram for malignant neoplasm of breast: Secondary | ICD-10-CM

## 2022-06-25 DIAGNOSIS — M810 Age-related osteoporosis without current pathological fracture: Secondary | ICD-10-CM

## 2022-06-25 DIAGNOSIS — M130 Polyarthritis, unspecified: Secondary | ICD-10-CM | POA: Diagnosis not present

## 2022-06-25 DIAGNOSIS — F331 Major depressive disorder, recurrent, moderate: Secondary | ICD-10-CM | POA: Diagnosis not present

## 2022-06-25 DIAGNOSIS — Z1389 Encounter for screening for other disorder: Secondary | ICD-10-CM | POA: Diagnosis not present

## 2022-06-25 DIAGNOSIS — Z1331 Encounter for screening for depression: Secondary | ICD-10-CM | POA: Diagnosis not present

## 2022-06-25 DIAGNOSIS — E782 Mixed hyperlipidemia: Secondary | ICD-10-CM | POA: Diagnosis not present

## 2022-06-25 DIAGNOSIS — F17210 Nicotine dependence, cigarettes, uncomplicated: Secondary | ICD-10-CM | POA: Diagnosis not present

## 2022-07-04 ENCOUNTER — Ambulatory Visit (HOSPITAL_COMMUNITY): Payer: BC Managed Care – PPO

## 2022-07-11 ENCOUNTER — Encounter: Payer: Self-pay | Admitting: *Deleted

## 2022-07-26 DIAGNOSIS — E039 Hypothyroidism, unspecified: Secondary | ICD-10-CM | POA: Diagnosis not present

## 2022-07-26 DIAGNOSIS — K219 Gastro-esophageal reflux disease without esophagitis: Secondary | ICD-10-CM | POA: Diagnosis not present

## 2022-08-25 DIAGNOSIS — E039 Hypothyroidism, unspecified: Secondary | ICD-10-CM | POA: Diagnosis not present

## 2022-08-25 DIAGNOSIS — K219 Gastro-esophageal reflux disease without esophagitis: Secondary | ICD-10-CM | POA: Diagnosis not present

## 2022-09-25 DIAGNOSIS — E039 Hypothyroidism, unspecified: Secondary | ICD-10-CM | POA: Diagnosis not present

## 2022-09-25 DIAGNOSIS — K219 Gastro-esophageal reflux disease without esophagitis: Secondary | ICD-10-CM | POA: Diagnosis not present

## 2022-10-26 DIAGNOSIS — E039 Hypothyroidism, unspecified: Secondary | ICD-10-CM | POA: Diagnosis not present

## 2022-10-26 DIAGNOSIS — K219 Gastro-esophageal reflux disease without esophagitis: Secondary | ICD-10-CM | POA: Diagnosis not present

## 2022-11-20 DIAGNOSIS — E039 Hypothyroidism, unspecified: Secondary | ICD-10-CM | POA: Diagnosis not present

## 2022-11-20 DIAGNOSIS — K219 Gastro-esophageal reflux disease without esophagitis: Secondary | ICD-10-CM | POA: Diagnosis not present

## 2022-11-25 DIAGNOSIS — K219 Gastro-esophageal reflux disease without esophagitis: Secondary | ICD-10-CM | POA: Diagnosis not present

## 2022-11-25 DIAGNOSIS — E039 Hypothyroidism, unspecified: Secondary | ICD-10-CM | POA: Diagnosis not present

## 2022-12-20 ENCOUNTER — Encounter: Payer: Self-pay | Admitting: *Deleted

## 2023-01-10 ENCOUNTER — Other Ambulatory Visit (HOSPITAL_COMMUNITY): Payer: Self-pay | Admitting: Internal Medicine

## 2023-01-10 DIAGNOSIS — R52 Pain, unspecified: Secondary | ICD-10-CM

## 2023-01-14 DIAGNOSIS — E782 Mixed hyperlipidemia: Secondary | ICD-10-CM | POA: Diagnosis not present

## 2023-01-14 DIAGNOSIS — Z23 Encounter for immunization: Secondary | ICD-10-CM | POA: Diagnosis not present

## 2023-01-14 DIAGNOSIS — E039 Hypothyroidism, unspecified: Secondary | ICD-10-CM | POA: Diagnosis not present

## 2023-01-14 DIAGNOSIS — F331 Major depressive disorder, recurrent, moderate: Secondary | ICD-10-CM | POA: Diagnosis not present

## 2023-01-14 DIAGNOSIS — M130 Polyarthritis, unspecified: Secondary | ICD-10-CM | POA: Diagnosis not present

## 2023-01-15 ENCOUNTER — Ambulatory Visit (HOSPITAL_COMMUNITY)
Admission: RE | Admit: 2023-01-15 | Discharge: 2023-01-15 | Disposition: A | Discharge status: Home / Self Care | Payer: Medicare Other | Source: Ambulatory Visit | Attending: Internal Medicine | Admitting: Internal Medicine

## 2023-01-15 DIAGNOSIS — R52 Pain, unspecified: Secondary | ICD-10-CM | POA: Insufficient documentation

## 2023-01-15 DIAGNOSIS — M25511 Pain in right shoulder: Secondary | ICD-10-CM | POA: Diagnosis not present

## 2023-01-15 DIAGNOSIS — M19011 Primary osteoarthritis, right shoulder: Secondary | ICD-10-CM | POA: Diagnosis not present

## 2023-01-29 ENCOUNTER — Other Ambulatory Visit (HOSPITAL_COMMUNITY)
Admission: RE | Admit: 2023-01-29 | Discharge: 2023-01-29 | Disposition: A | Discharge status: Home / Self Care | Payer: Medicare Other | Source: Ambulatory Visit | Attending: Internal Medicine | Admitting: Internal Medicine

## 2023-01-29 DIAGNOSIS — N390 Urinary tract infection, site not specified: Secondary | ICD-10-CM | POA: Diagnosis not present

## 2023-01-29 DIAGNOSIS — E039 Hypothyroidism, unspecified: Secondary | ICD-10-CM | POA: Diagnosis not present

## 2023-01-29 LAB — URINALYSIS, W/ REFLEX TO CULTURE (INFECTION SUSPECTED)
Bacteria, UA: NONE SEEN
Bilirubin Urine: NEGATIVE
Glucose, UA: NEGATIVE mg/dL
Hgb urine dipstick: NEGATIVE
Ketones, ur: NEGATIVE mg/dL
Leukocytes,Ua: NEGATIVE
Nitrite: NEGATIVE
Protein, ur: NEGATIVE mg/dL
Specific Gravity, Urine: 1.023 (ref 1.005–1.030)
pH: 5 (ref 5.0–8.0)

## 2023-01-29 LAB — TSH: TSH: 20.865 u[IU]/mL — ABNORMAL HIGH (ref 0.350–4.500)

## 2023-01-29 LAB — T4, FREE: Free T4: 0.56 ng/dL — ABNORMAL LOW (ref 0.61–1.12)

## 2023-02-13 DIAGNOSIS — K219 Gastro-esophageal reflux disease without esophagitis: Secondary | ICD-10-CM | POA: Diagnosis not present

## 2023-02-13 DIAGNOSIS — E039 Hypothyroidism, unspecified: Secondary | ICD-10-CM | POA: Diagnosis not present

## 2023-03-19 DIAGNOSIS — K30 Functional dyspepsia: Secondary | ICD-10-CM | POA: Diagnosis not present

## 2023-03-19 DIAGNOSIS — K219 Gastro-esophageal reflux disease without esophagitis: Secondary | ICD-10-CM | POA: Diagnosis not present

## 2023-03-19 DIAGNOSIS — E039 Hypothyroidism, unspecified: Secondary | ICD-10-CM | POA: Diagnosis not present

## 2023-03-20 NOTE — Progress Notes (Unsigned)
  Intake history:  There were no vitals taken for this visit. There is no height or weight on file to calculate BMI.    WHAT ARE WE SEEING YOU FOR TODAY?   right shoulder  How long has this bothered you? (DOI?DOS?WS?)  ***  Anticoag.  No  Diabetes No  Heart disease No  Hypertension No  SMOKING HX yes  Kidney disease No  Any ALLERGIES ______________________________________________   Treatment:  Have you taken:  Tylenol {yes/no:20286}  Advil {yes/no:20286}  Had PT {yes/no:20286}  Had injection {yes/no:20286}  Other  _________________________

## 2023-03-21 ENCOUNTER — Ambulatory Visit: Payer: Medicare Other | Admitting: Orthopedic Surgery

## 2023-03-21 ENCOUNTER — Ambulatory Visit (HOSPITAL_COMMUNITY)
Admission: RE | Admit: 2023-03-21 | Discharge: 2023-03-21 | Disposition: A | Discharge status: Home / Self Care | Payer: Medicare Other | Source: Ambulatory Visit | Attending: Gerontology | Admitting: Gerontology

## 2023-03-21 ENCOUNTER — Encounter: Payer: Self-pay | Admitting: Orthopedic Surgery

## 2023-03-21 ENCOUNTER — Other Ambulatory Visit (HOSPITAL_COMMUNITY)
Admission: RE | Admit: 2023-03-21 | Discharge: 2023-03-21 | Disposition: A | Discharge status: Home / Self Care | Payer: Medicare Other | Source: Ambulatory Visit | Attending: Internal Medicine | Admitting: Internal Medicine

## 2023-03-21 ENCOUNTER — Other Ambulatory Visit (HOSPITAL_COMMUNITY): Payer: Self-pay | Admitting: Gerontology

## 2023-03-21 VITALS — BP 107/77 | HR 98 | Ht 65.0 in | Wt 136.0 lb

## 2023-03-21 DIAGNOSIS — R053 Chronic cough: Secondary | ICD-10-CM | POA: Diagnosis not present

## 2023-03-21 DIAGNOSIS — M25511 Pain in right shoulder: Secondary | ICD-10-CM

## 2023-03-21 DIAGNOSIS — F17200 Nicotine dependence, unspecified, uncomplicated: Secondary | ICD-10-CM

## 2023-03-21 DIAGNOSIS — F17210 Nicotine dependence, cigarettes, uncomplicated: Secondary | ICD-10-CM | POA: Diagnosis not present

## 2023-03-21 DIAGNOSIS — G8929 Other chronic pain: Secondary | ICD-10-CM | POA: Diagnosis not present

## 2023-03-21 DIAGNOSIS — E039 Hypothyroidism, unspecified: Secondary | ICD-10-CM | POA: Diagnosis not present

## 2023-03-21 DIAGNOSIS — I700 Atherosclerosis of aorta: Secondary | ICD-10-CM | POA: Diagnosis not present

## 2023-03-21 LAB — TSH: TSH: 8.49 u[IU]/mL — ABNORMAL HIGH (ref 0.350–4.500)

## 2023-03-21 LAB — T4, FREE: Free T4: 0.76 ng/dL (ref 0.61–1.12)

## 2023-03-21 NOTE — Progress Notes (Signed)
  Intake history:  There were no vitals taken for this visit. There is no height or weight on file to calculate BMI.    WHAT ARE WE SEEING YOU FOR TODAY?   right shoulder  How long has this bothered you? (DOI?DOS?WS?)  ***  Anticoag.  No  Diabetes No  Heart disease No  Hypertension No  SMOKING HX yes  Kidney disease No  Any ALLERGIES ______________________________________________   Treatment:  Have you taken:  Tylenol {yes/no:20286}  Advil {yes/no:20286}  Had PT {yes/no:20286}  Had injection {yes/no:20286}  Other  _________________________

## 2023-03-21 NOTE — Progress Notes (Signed)
  Subjective:     Patient ID: Melinda Mathis, female   DOB: 07/08/1955, 68 y.o.   MRN: [id number]  Shoulder Pain  67yo female with right shoulder pain since age 64.  She had some type of trauma back then and has noticed pain in the right shoulder over the years.  She has had some increased episodes of pain which spontaneously resolved and are associated with range of motion deficits when she is having pain  She says she has rheumatoid arthritis.  She is on meloxicam takes an Aleve every now and then but reports no major injury recently   Review of Systems  Musculoskeletal:  Positive for arthralgias, back pain and myalgias.       Objective:   Physical Exam Vitals and nursing note reviewed.  Constitutional:      Appearance: Normal appearance.  HENT:     Head: Normocephalic and atraumatic.  Eyes:     General: No scleral icterus.       Right eye: No discharge.        Left eye: No discharge.     Extraocular Movements: Extraocular movements intact.     Conjunctiva/sclera: Conjunctivae normal.     Pupils: Pupils are equal, round, and reactive to light.  Cardiovascular:     Rate and Rhythm: Normal rate.     Pulses: Normal pulses.  Musculoskeletal:     Right shoulder: Tenderness present. No swelling, deformity, effusion, laceration or crepitus. Decreased range of motion. Normal strength. Normal pulse.     Comments: Mild pain with resistance testing in abduction and flexion slight decrease in overall flexion in the scapular plane.  No evidence of cuff tear mild impingement  Some of the tenderness was anteriorly in the bicipital groove  Skin:    General: Skin is warm and dry.     Capillary Refill: Capillary refill takes less than 2 seconds.  Neurological:     General: No focal deficit present.     Mental Status: She is alert and oriented to person, place, and time.  Psychiatric:        Mood and Affect: Mood normal.        Behavior: Behavior normal.        Thought Content: Thought  content normal.        Judgment: Judgment normal.      Assessment:     Outside imaging shows a break in Shenton's line of the shoulder decreased humeral head acromial distance which may indicate cuff weakness and/or cuff tear chronic.  However no degenerative changes are noted  No evidence of fracture or arthritis  Rotator cuff syndrome    Plan:     Recommend physical therapy continue meloxicam  Patient should be seen by rheumatologist for medical management of her rheumatoid arthritis

## 2023-03-21 NOTE — Patient Instructions (Addendum)
Physical therapy has been ordered for you at [facility]. They should call you to schedule, [phone] is the phone number to call, if you want to call to schedule.    Ask your primary care about seeing a Rheumatologist since you state you have Rheumatoid Arthritis

## 2023-03-24 NOTE — Progress Notes (Unsigned)
GI Office Note    Referring Provider: Benetta Spar* Primary Care Physician:  Benetta Spar, MD  Primary Gastroenterologist:  Chief Complaint   No chief complaint on file.   History of Present Illness   Melinda Mathis is a 68 y.o. female presenting today at the request of Melody Comas for GERD.        Medications   Current Outpatient Medications  Medication Sig Dispense Refill   atorvastatin (LIPITOR) 20 MG tablet Take 1 tablet by mouth every evening for cholesterol. 30 tablet 0   DULoxetine (CYMBALTA) 30 MG capsule Take 30 mg by mouth 2 (two) times daily.     levothyroxine (SYNTHROID) 25 MCG tablet TAKE 1 TABLET BY MOUTH ONCE DAILY ON AN EMPTY STOMACH WITH A GLASS OF WATER AT LEAST 30 TO 60 MINUTES BEFORE BREAKFAST. 30 tablet 0   meloxicam (MOBIC) 7.5 MG tablet Take 7.5 mg by mouth daily.     nicotine (NICODERM CQ - DOSED IN MG/24 HOURS) 21 mg/24hr patch 21 mg daily.     omeprazole (PRILOSEC) 40 MG capsule Take by mouth daily.     tiZANidine (ZANAFLEX) 2 MG tablet Take 2 mg by mouth 2 (two) times daily as needed.     VENTOLIN HFA 108 (90 Base) MCG/ACT inhaler Inhale into the lungs.     No current facility-administered medications for this visit.    Allergies   Allergies as of 03/25/2023   (No Known Allergies)     Past Medical History   Past Medical History:  Diagnosis Date   Arthritis    Chickenpox    Depression    GERD (gastroesophageal reflux disease)    IBS (irritable bowel syndrome)    Thyroid disease    TIA (transient ischemic attack)    UTI (urinary tract infection)     Past Surgical History   Past Surgical History:  Procedure Laterality Date   ABDOMINAL HYSTERECTOMY     APPENDECTOMY     BREAST BIOPSY  1992    Past Family History   Family History  Problem Relation Age of Onset   Arthritis Mother    Cancer Mother        lung   COPD Mother    Depression Mother    Hyperlipidemia Mother    Hypertension  Mother    Arthritis Father     Past Social History   Social History   Socioeconomic History   Marital status: Single    Spouse name: Not on file   Number of children: Not on file   Years of education: Not on file   Highest education level: Not on file  Occupational History   Not on file  Tobacco Use   Smoking status: Every Day   Smokeless tobacco: Never  Substance and Sexual Activity   Alcohol use: Never   Drug use: Not on file   Sexual activity: Not on file  Other Topics Concern   Not on file  Social History Narrative   Divorced.    No children.   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    Enjoys reading, spending time outdoors.    Social Drivers of Corporate investment banker Strain: Not on file  Food Insecurity: Not on file  Transportation Needs: Not on file  Physical Activity: Not on file  Stress: Not on file  Social Connections: Not on file  Intimate Partner Violence: Not on file    Review of Systems   General: Negative  for anorexia, weight loss, fever, chills, fatigue, weakness. ENT: Negative for hoarseness, difficulty swallowing , nasal congestion. CV: Negative for chest pain, angina, palpitations, dyspnea on exertion, peripheral edema.  Respiratory: Negative for dyspnea at rest, dyspnea on exertion, cough, sputum, wheezing.  GI: See history of present illness. GU:  Negative for dysuria, hematuria, urinary incontinence, urinary frequency, nocturnal urination.  Endo: Negative for unusual weight change.     Physical Exam   There were no vitals taken for this visit.   General: Well-nourished, well-developed in no acute distress.  Eyes: No icterus. Mouth: Oropharyngeal mucosa moist and pink , no lesions erythema or exudate. Lungs: Clear to auscultation bilaterally.  Heart: Regular rate and rhythm, no murmurs rubs or gallops.  Abdomen: Bowel sounds are normal, nontender, nondistended, no hepatosplenomegaly or masses,  no abdominal bruits or hernia , no  rebound or guarding.  Rectal: ***  Extremities: No lower extremity edema. No clubbing or deformities. Neuro: Alert and oriented x 4   Skin: Warm and dry, no jaundice.   Psych: Alert and cooperative, normal mood and affect.  Labs   *** Imaging Studies   No results found.  Assessment       PLAN   ***   Leanna Battles. Melvyn Neth, MHS, PA-C [facility]

## 2023-03-25 ENCOUNTER — Encounter: Payer: Self-pay | Admitting: Gastroenterology

## 2023-03-25 ENCOUNTER — Ambulatory Visit (INDEPENDENT_AMBULATORY_CARE_PROVIDER_SITE_OTHER): Payer: Medicare Other | Admitting: Gastroenterology

## 2023-03-25 VITALS — BP 130/75 | HR 125 | Temp 98.2°F | Ht 66.0 in | Wt 135.8 lb

## 2023-03-25 DIAGNOSIS — R1013 Epigastric pain: Secondary | ICD-10-CM | POA: Insufficient documentation

## 2023-03-25 DIAGNOSIS — Z1211 Encounter for screening for malignant neoplasm of colon: Secondary | ICD-10-CM | POA: Insufficient documentation

## 2023-03-25 DIAGNOSIS — K219 Gastro-esophageal reflux disease without esophagitis: Secondary | ICD-10-CM | POA: Diagnosis not present

## 2023-03-25 MED ORDER — SUCRALFATE 1 G PO TABS
1.0000 g | ORAL_TABLET | Freq: Three times a day (TID) | ORAL | 0 refills | Status: DC
Start: 2023-03-25 — End: 2023-05-01

## 2023-03-25 NOTE — Patient Instructions (Signed)
Continue omeprazole 40mg  daily before breakfast. Add sucralfate 1 gram before meals and at bedtime (4 times daily) for abdominal burning. Complete labs. We will schedule your colonoscopy and upper endoscopy after results available.

## 2023-03-26 ENCOUNTER — Encounter: Payer: Self-pay | Admitting: Gastroenterology

## 2023-03-28 ENCOUNTER — Encounter (HOSPITAL_COMMUNITY): Payer: Self-pay | Admitting: Occupational Therapy

## 2023-03-28 ENCOUNTER — Ambulatory Visit (HOSPITAL_COMMUNITY): Payer: Medicare Other | Attending: Orthopedic Surgery | Admitting: Occupational Therapy

## 2023-03-28 DIAGNOSIS — M25611 Stiffness of right shoulder, not elsewhere classified: Secondary | ICD-10-CM | POA: Diagnosis not present

## 2023-03-28 DIAGNOSIS — M25511 Pain in right shoulder: Secondary | ICD-10-CM | POA: Diagnosis not present

## 2023-03-28 DIAGNOSIS — G8929 Other chronic pain: Secondary | ICD-10-CM | POA: Diagnosis not present

## 2023-03-28 DIAGNOSIS — R29898 Other symptoms and signs involving the musculoskeletal system: Secondary | ICD-10-CM | POA: Diagnosis not present

## 2023-03-28 NOTE — Therapy (Signed)
 OUTPATIENT OCCUPATIONAL THERAPY ORTHO EVALUATION  Patient Name: Melinda Mathis MRN: [id number] DOB:Nov 12, 1955, 68 y.o., female Today's Date: 03/29/2023   END OF SESSION:  OT End of Session - 03/29/23 1439     Visit Number 1    Number of Visits 12    Date for OT Re-Evaluation 05/17/23    Authorization Type Medicare Part A and B    OT Start Time 1434    OT Stop Time 1517    OT Time Calculation (min) 43 min    Activity Tolerance Patient tolerated treatment well    Behavior During Therapy WFL for tasks assessed/performed             Past Medical History:  Diagnosis Date   Arthritis    Chickenpox    Chronic bronchitis (HCC)    Depression    GERD (gastroesophageal reflux disease)    IBS (irritable bowel syndrome)    Thyroid  disease    1980s, hyperthyroidism, s/p iodine treatment. now hypothyroidism   TIA (transient ischemic attack)    UTI (urinary tract infection)    Past Surgical History:  Procedure Laterality Date   ABDOMINAL HYSTERECTOMY     APPENDECTOMY     BREAST BIOPSY  1992   Patient Active Problem List   Diagnosis Date Noted   GERD (gastroesophageal reflux disease) 03/25/2023   Abdominal pain, epigastric 03/25/2023   Colon cancer screening 03/25/2023   Hypothyroidism 07/01/2017   Hyperlipidemia 07/01/2017   Chronic back pain 07/01/2017   Anxiety and depression 07/01/2017   Osteoarthritis 07/01/2017    PCP: Carlette Benita Area, MD REFERRING PROVIDER: Margrette Bars, MD  ONSET DATE: ~1 month  REFERRING DIAG: M25.511,G89.29 (ICD-10-CM) - Chronic right shoulder pain   THERAPY DIAG:  Chronic right shoulder pain  Shoulder stiffness, right  Other symptoms and signs involving the musculoskeletal system  Rationale for Evaluation and Treatment: Rehabilitation  SUBJECTIVE:   SUBJECTIVE STATEMENT: It all started when I was 15. Pt accompanied by: self  PERTINENT HISTORY: Pt reports having chronic shoulder pain since she was a teenager due  to multiple sports injuries. Recently pain has worsened with decreased ROM as well. PMH significant for RA, Back pain, and OA.   PRECAUTIONS: None  WEIGHT BEARING RESTRICTIONS: No  PAIN:  Are you having pain? Yes: NPRS scale: 9/10 Pain location: trapezius to deltoid Pain description: Aching Aggravating factors: Movement  Relieving factors: medication  FALLS: Has patient fallen in last 6 months? No  PLOF: Independent  PATIENT GOALS: To reduce pain  NEXT MD VISIT: None  OBJECTIVE:   HAND DOMINANCE: Right  ADLs: Overall ADLs: Pt has pain with activities such as brushing her hair and teeth. Pt requires compensatory strategies to complete BADL's.   FUNCTIONAL OUTCOME MEASURES: Upper Extremity Functional Scale (UEFS): 47/80 - 58.8%  UPPER EXTREMITY ROM:       Assessed in Seated, er/IR adducted  Active ROM Right eval  Shoulder flexion 114  Shoulder abduction 113  Shoulder internal rotation 90  Shoulder external rotation 85  (Blank rows = not tested)    UPPER EXTREMITY MMT:     Assessed in seated, er/IR adducted  MMT Right eval  Shoulder flexion 3+/5  Shoulder abduction 3+/5  Shoulder internal rotation 4-/5  Shoulder external rotation 4-/5  (Blank rows = not tested)  SENSATION: WFL  EDEMA: No swelling noted  OBSERVATIONS: Moderate fascial restrictions along anterior shoulder girdle, as well as deltoid, scapular region, and biceps.    TODAY'S TREATMENT:  DATE:   03/28/23 -Evaluation -Measurements -Table Slides: flexion, abduction, x10 -Wall Slides: flexion, abduction, x10    PATIENT EDUCATION: Education details: Table Slides and Wall Slides Person educated: Patient Education method: Explanation, Demonstration, and Handouts Education comprehension: verbalized understanding and returned demonstration  HOME EXERCISE  PROGRAM: 2/6: Table Slides and Wall Slides  GOALS: Goals reviewed with patient? Yes   SHORT TERM GOALS: Target date: 04/19/23  Pt will be provided with and educated on HEP to improve mobility in RUE required for use during ADL completion.   Goal status: INITIAL   LONG TERM GOALS: Target date: 05/17/23  Pt will decrease pain in RUE to 3/10 or less to improve ability to sleep for 2+ consecutive hours without waking due to pain.   Goal status: INITIAL  2.  Pt will decrease RUE fascial restrictions to min amounts or less to improve mobility required for functional reaching tasks.   Goal status: INITIAL  3.  Pt will increase RUE A/ROM by 30 degrees to improve ability to use RUE when reaching overhead or behind back during dressing and bathing tasks.   Goal status: INITIAL  4.  Pt will increase RUE strength to 4+/5 or greater to improve ability to use RUE when lifting or carrying items during meal preparation/housework/yardwork tasks.   Goal status: INITIAL  5.  Pt will return to highest level of function using RUE as dominant during functional task completion.   Goal status: INITIAL   ASSESSMENT:  CLINICAL IMPRESSION: Patient is a 68 y.o. female who was seen today for occupational therapy evaluation for Right Shoulder Pain and weakness. Pt presents with increased pain and fascial restrictions, decreased ROM, strength, and functional use of the RUE.   PERFORMANCE DEFICITS: in functional skills including in functional skills including ADLs, IADLs, coordination, tone, ROM, strength, pain, fascial restrictions, muscle spasms, and UE functional use.  IMPAIRMENTS: are limiting patient from ADLs, IADLs, rest and sleep, work, leisure, and social participation.   COMORBIDITIES: may have co-morbidities  that affects occupational performance. Patient will benefit from skilled OT to address above impairments and improve overall function.  MODIFICATION OR ASSISTANCE TO COMPLETE  EVALUATION: No modification of tasks or assist necessary to complete an evaluation.  OT OCCUPATIONAL PROFILE AND HISTORY: Problem focused assessment: Including review of records relating to presenting problem.  CLINICAL DECISION MAKING: LOW - limited treatment options, no task modification necessary  REHAB POTENTIAL: Good  EVALUATION COMPLEXITY: Low      PLAN:  OT FREQUENCY: 2x/week  OT DURATION: 6 weeks  PLANNED INTERVENTIONS: 97168 OT Re-evaluation, 97535 self care/ADL training, 02889 therapeutic exercise, 97530 therapeutic activity, 97112 neuromuscular re-education, 97140 manual therapy, 97035 ultrasound, 97010 moist heat, 97032 electrical stimulation (manual), passive range of motion, functional mobility training, energy conservation, coping strategies training, patient/family education, and DME and/or AE instructions  RECOMMENDED OTHER SERVICES: N/A  CONSULTED AND AGREED WITH PLAN OF CARE: Patient  PLAN FOR NEXT SESSION: Manual Therapy, P/ROM, AA/ROM, A/ROM, wall slides, isometrics   Leyanna Bittman Thelbert, OTR/L [facility] [phone] Tasnim Balentine Jillyn Thelbert, OT 03/29/2023, 2:42 PM

## 2023-03-28 NOTE — Patient Instructions (Signed)

## 2023-04-01 DIAGNOSIS — K219 Gastro-esophageal reflux disease without esophagitis: Secondary | ICD-10-CM | POA: Diagnosis not present

## 2023-04-01 DIAGNOSIS — Z1211 Encounter for screening for malignant neoplasm of colon: Secondary | ICD-10-CM | POA: Diagnosis not present

## 2023-04-01 DIAGNOSIS — R1013 Epigastric pain: Secondary | ICD-10-CM | POA: Diagnosis not present

## 2023-04-01 LAB — CBC WITH DIFFERENTIAL/PLATELET
Absolute Lymphocytes: 2215 {cells}/uL (ref 850–3900)
Absolute Monocytes: 904 {cells}/uL (ref 200–950)
Basophils Absolute: 90 {cells}/uL (ref 0–200)
Basophils Relative: 0.8 %
Eosinophils Absolute: 192 {cells}/uL (ref 15–500)
Eosinophils Relative: 1.7 %
HCT: 39.7 % (ref 35.0–45.0)
Hemoglobin: 12.9 g/dL (ref 11.7–15.5)
MCH: 29 pg (ref 27.0–33.0)
MCHC: 32.5 g/dL (ref 32.0–36.0)
MCV: 89.2 fL (ref 80.0–100.0)
MPV: 10 fL (ref 7.5–12.5)
Monocytes Relative: 8 %
Neutro Abs: 7899 {cells}/uL — ABNORMAL HIGH (ref 1500–7800)
Neutrophils Relative %: 69.9 %
Platelets: 493 10*3/uL — ABNORMAL HIGH (ref 140–400)
RBC: 4.45 10*6/uL (ref 3.80–5.10)
RDW: 14.7 % (ref 11.0–15.0)
Total Lymphocyte: 19.6 %
WBC: 11.3 10*3/uL — ABNORMAL HIGH (ref 3.8–10.8)

## 2023-04-02 ENCOUNTER — Other Ambulatory Visit: Payer: Self-pay

## 2023-04-02 DIAGNOSIS — R1013 Epigastric pain: Secondary | ICD-10-CM

## 2023-04-02 DIAGNOSIS — K219 Gastro-esophageal reflux disease without esophagitis: Secondary | ICD-10-CM

## 2023-04-02 NOTE — Progress Notes (Signed)
I left a message asking patient to call back to get scheduled for a 4 week follow up appt

## 2023-04-10 ENCOUNTER — Encounter (HOSPITAL_COMMUNITY): Payer: Medicare Other | Admitting: Occupational Therapy

## 2023-04-15 ENCOUNTER — Encounter: Payer: Self-pay | Admitting: *Deleted

## 2023-04-18 ENCOUNTER — Encounter (HOSPITAL_COMMUNITY): Payer: Medicare Other | Admitting: Occupational Therapy

## 2023-04-19 DIAGNOSIS — E039 Hypothyroidism, unspecified: Secondary | ICD-10-CM | POA: Diagnosis not present

## 2023-04-19 DIAGNOSIS — K219 Gastro-esophageal reflux disease without esophagitis: Secondary | ICD-10-CM | POA: Diagnosis not present

## 2023-04-30 ENCOUNTER — Other Ambulatory Visit: Payer: Self-pay | Admitting: Gastroenterology

## 2023-05-01 ENCOUNTER — Telehealth: Payer: Self-pay | Admitting: Orthopedic Surgery

## 2023-05-01 DIAGNOSIS — G8929 Other chronic pain: Secondary | ICD-10-CM

## 2023-05-01 NOTE — Telephone Encounter (Signed)
 Dr. Mort Sawyers pt - spoke w/the pt, she is in a lot of pain and requesting a pain medication to be sent to [facility]  She is also requesting an order for PT for her back for dry needling.

## 2023-05-01 NOTE — Telephone Encounter (Signed)
 Patient due labs and has not scheduled her procedures or follow up visit.

## 2023-05-01 NOTE — Telephone Encounter (Signed)
I called her to advise / 

## 2023-05-01 NOTE — Telephone Encounter (Signed)
 Sent order for Dry Needling Dr Rexene Edison gave Meloxicam and Tizanidine  He does not prescribe narcotics unless surgical patient/ she is not.

## 2023-05-02 ENCOUNTER — Ambulatory Visit (HOSPITAL_COMMUNITY): Attending: Orthopedic Surgery | Admitting: Occupational Therapy

## 2023-05-02 ENCOUNTER — Encounter (HOSPITAL_COMMUNITY): Payer: Self-pay | Admitting: Occupational Therapy

## 2023-05-02 DIAGNOSIS — R29898 Other symptoms and signs involving the musculoskeletal system: Secondary | ICD-10-CM | POA: Insufficient documentation

## 2023-05-02 DIAGNOSIS — M25611 Stiffness of right shoulder, not elsewhere classified: Secondary | ICD-10-CM | POA: Diagnosis present

## 2023-05-02 DIAGNOSIS — M25511 Pain in right shoulder: Secondary | ICD-10-CM | POA: Insufficient documentation

## 2023-05-02 DIAGNOSIS — G8929 Other chronic pain: Secondary | ICD-10-CM | POA: Insufficient documentation

## 2023-05-02 NOTE — Therapy (Signed)
 OUTPATIENT OCCUPATIONAL THERAPY ORTHO TREATMENT  Patient Name: Melinda Mathis MRN: [id number] DOB:1955-11-14, 68 y.o., female Today's Date: 05/02/2023   END OF SESSION:  OT End of Session - 05/02/23 1426     Visit Number 2    Number of Visits 12    Date for OT Re-Evaluation 05/17/23    Authorization Type Medicare Part A and B    OT Start Time 1347    OT Stop Time 1430    OT Time Calculation (min) 43 min    Activity Tolerance Patient tolerated treatment well    Behavior During Therapy WFL for tasks assessed/performed              Past Medical History:  Diagnosis Date   Arthritis    Chickenpox    Chronic bronchitis (HCC)    Depression    GERD (gastroesophageal reflux disease)    IBS (irritable bowel syndrome)    Thyroid disease    1980s, hyperthyroidism, s/p iodine treatment. now hypothyroidism   TIA (transient ischemic attack)    UTI (urinary tract infection)    Past Surgical History:  Procedure Laterality Date   ABDOMINAL HYSTERECTOMY     APPENDECTOMY     BREAST BIOPSY  1992   Patient Active Problem List   Diagnosis Date Noted   GERD (gastroesophageal reflux disease) 03/25/2023   Abdominal pain, epigastric 03/25/2023   Colon cancer screening 03/25/2023   Hypothyroidism 07/01/2017   Hyperlipidemia 07/01/2017   Chronic back pain 07/01/2017   Anxiety and depression 07/01/2017   Osteoarthritis 07/01/2017    PCP: Benetta Spar, MD REFERRING PROVIDER: Fuller Canada, MD  ONSET DATE: ~1 month  REFERRING DIAG: M25.511,G89.29 (ICD-10-CM) - Chronic right shoulder pain   THERAPY DIAG:  Chronic right shoulder pain  Shoulder stiffness, right  Other symptoms and signs involving the musculoskeletal system  Rationale for Evaluation and Treatment: Rehabilitation  SUBJECTIVE:   SUBJECTIVE STATEMENT: S: "I lost my papers."  PERTINENT HISTORY: Pt reports having chronic shoulder pain since she was a teenager due to multiple sports injuries.  Recently pain has worsened with decreased ROM as well. PMH significant for RA, Back pain, and OA.   PRECAUTIONS: None  WEIGHT BEARING RESTRICTIONS: No  PAIN:  Are you having pain? Yes: NPRS scale: 6/10 Pain location: trapezius to deltoid Pain description: Aching Aggravating factors: Movement  Relieving factors: medication  FALLS: Has patient fallen in last 6 months? No  PLOF: Independent  PATIENT GOALS: "To reduce pain"  NEXT MD VISIT: None  OBJECTIVE:   HAND DOMINANCE: Right  ADLs: Overall ADLs: Pt has pain with activities such as brushing her hair and teeth. Pt requires compensatory strategies to complete BADL's.   FUNCTIONAL OUTCOME MEASURES: Upper Extremity Functional Scale (UEFS): 47/80 - 58.8%  UPPER EXTREMITY ROM:       Assessed in Seated, er/IR adducted  Active ROM Right eval  Shoulder flexion 114  Shoulder abduction 113  Shoulder internal rotation 90  Shoulder external rotation 85  (Blank rows = not tested)    UPPER EXTREMITY MMT:     Assessed in seated, er/IR adducted  MMT Right eval  Shoulder flexion 3+/5  Shoulder abduction 3+/5  Shoulder internal rotation 4-/5  Shoulder external rotation 4-/5  (Blank rows = not tested)   OBSERVATIONS: Moderate fascial restrictions along anterior shoulder girdle, as well as deltoid, scapular region, and biceps.    TODAY'S TREATMENT:  DATE:  05/02/23 -Manual techniques-myofascial release to right upper arm, trapezius, and scapular regions to decrease pain and fascial restrictions and increase joint ROM -P/ROM: supine-flexion, abduction, er, horizontal abduction, 5 reps -A/ROM: supine-protraction, flexion, horizontal abduction, er, abduction, 10 reps -Proximal shoulder strengthening: supine-paddles, criss cross, circles each direction, 10 reps -A/ROM: standing-protraction, flexion,  horizontal abduction, er, abduction, 5 reps  03/28/23 -Evaluation -Measurements -Table Slides: flexion, abduction, x10 -Wall Slides: flexion, abduction, x10    PATIENT EDUCATION: Education details: A/ROM Person educated: Patient Education method: Programmer, multimedia, Facilities manager, and Handouts Education comprehension: verbalized understanding and returned demonstration  HOME EXERCISE PROGRAM: 2/6: Table Slides and Wall Slides 3/13: A/ROM  GOALS: Goals reviewed with patient? Yes   SHORT TERM GOALS: Target date: 04/19/23  Pt will be provided with and educated on HEP to improve mobility in RUE required for use during ADL completion.   Goal status: IN PROGRESS   LONG TERM GOALS: Target date: 05/17/23  Pt will decrease pain in RUE to 3/10 or less to improve ability to sleep for 2+ consecutive hours without waking due to pain.   Goal status: IN PROGRESS  2.  Pt will decrease RUE fascial restrictions to min amounts or less to improve mobility required for functional reaching tasks.   Goal status: IN PROGRESS  3.  Pt will increase RUE A/ROM by 30 degrees to improve ability to use RUE when reaching overhead or behind back during dressing and bathing tasks.   Goal status: IN PROGRESS  4.  Pt will increase RUE strength to 4+/5 or greater to improve ability to use RUE when lifting or carrying items during meal preparation/housework/yardwork tasks.   Goal status: IN PROGRESS  5.  Pt will return to highest level of function using RUE as dominant during functional task completion.   Goal status: IN PROGRESS   ASSESSMENT:  CLINICAL IMPRESSION: Pt reports she has not been able to complete her HEP. Pt uses hemp cream, a heating pad, and pain medication for her pain. Initiated myofascial release and A/ROM today, proximal shoulder strengthening for stability work. Pt tangential during session, requiring frequent redirection to task. Verbal cuing for form and technique. When supine pt  achieving full ROM, when standing pt at 50% ROM due to triggering back spasms.   PERFORMANCE DEFICITS: in functional skills including in functional skills including ADLs, IADLs, coordination, tone, ROM, strength, pain, fascial restrictions, muscle spasms, and UE functional use.     PLAN:  OT FREQUENCY: 2x/week  OT DURATION: 6 weeks  PLANNED INTERVENTIONS: 97168 OT Re-evaluation, 97535 self care/ADL training, 28413 therapeutic exercise, 97530 therapeutic activity, 97112 neuromuscular re-education, 97140 manual therapy, 97035 ultrasound, 97010 moist heat, 97032 electrical stimulation (manual), passive range of motion, functional mobility training, energy conservation, coping strategies training, patient/family education, and DME and/or AE instructions  CONSULTED AND AGREED WITH PLAN OF CARE: Patient  PLAN FOR NEXT SESSION: Manual Therapy, P/ROM, AA/ROM, A/ROM, wall slides, isometrics   Ezra Sites, OTR/L  [phone] 05/02/2023, 3:22 PM

## 2023-05-02 NOTE — Patient Instructions (Signed)

## 2023-05-08 ENCOUNTER — Encounter (HOSPITAL_COMMUNITY): Admitting: Occupational Therapy

## 2023-05-17 ENCOUNTER — Ambulatory Visit (HOSPITAL_COMMUNITY): Admitting: Occupational Therapy

## 2023-05-17 ENCOUNTER — Encounter (HOSPITAL_COMMUNITY): Payer: Self-pay | Admitting: Occupational Therapy

## 2023-05-17 DIAGNOSIS — G8929 Other chronic pain: Secondary | ICD-10-CM

## 2023-05-17 DIAGNOSIS — M25611 Stiffness of right shoulder, not elsewhere classified: Secondary | ICD-10-CM

## 2023-05-17 DIAGNOSIS — R29898 Other symptoms and signs involving the musculoskeletal system: Secondary | ICD-10-CM

## 2023-05-17 DIAGNOSIS — M25511 Pain in right shoulder: Secondary | ICD-10-CM | POA: Diagnosis not present

## 2023-05-17 NOTE — Patient Instructions (Signed)
 Repeat all exercises 10-15 times, 1-2 times per day.  1) Shoulder Protraction    Begin with elbows by your side, slowly "punch" straight out in front of you.      2) Shoulder Flexion  Supine:     Standing:         Begin with arms at your side with thumbs pointed up, slowly raise both arms up and forward towards overhead.               3) Horizontal abduction/adduction  Supine:   Standing:

## 2023-05-17 NOTE — Therapy (Signed)
 OUTPATIENT OCCUPATIONAL THERAPY ORTHO RE ASSESSMENT TREATMENT  Patient Name: Melinda Mathis MRN: [id number] DOB:16-May-1955, 68 y.o., female Today's Date: 05/17/2023   END OF SESSION:  OT End of Session - 05/17/23 1509     Visit Number 3    Number of Visits 12    Date for OT Re-Evaluation 06/14/23    Authorization Type Medicare Part A and B    OT Start Time 1345    OT Stop Time 1425    OT Time Calculation (min) 40 min    Activity Tolerance Patient tolerated treatment well    Behavior During Therapy WFL for tasks assessed/performed               Past Medical History:  Diagnosis Date   Arthritis    Chickenpox    Chronic bronchitis (HCC)    Depression    GERD (gastroesophageal reflux disease)    IBS (irritable bowel syndrome)    Thyroid disease    1980s, hyperthyroidism, s/p iodine treatment. now hypothyroidism   TIA (transient ischemic attack)    UTI (urinary tract infection)    Past Surgical History:  Procedure Laterality Date   ABDOMINAL HYSTERECTOMY     APPENDECTOMY     BREAST BIOPSY  1992   Patient Active Problem List   Diagnosis Date Noted   GERD (gastroesophageal reflux disease) 03/25/2023   Abdominal pain, epigastric 03/25/2023   Colon cancer screening 03/25/2023   Hypothyroidism 07/01/2017   Hyperlipidemia 07/01/2017   Chronic back pain 07/01/2017   Anxiety and depression 07/01/2017   Osteoarthritis 07/01/2017    PCP: Benetta Spar, MD REFERRING PROVIDER: Fuller Canada, MD  ONSET DATE: ~1 month  REFERRING DIAG: M25.511,G89.29 (ICD-10-CM) - Chronic right shoulder pain   THERAPY DIAG:  Chronic right shoulder pain  Shoulder stiffness, right  Other symptoms and signs involving the musculoskeletal system  Rationale for Evaluation and Treatment: Rehabilitation  SUBJECTIVE:   SUBJECTIVE STATEMENT: S: "My issue today is my back spasms."  PERTINENT HISTORY: Pt reports having chronic shoulder pain since she was a teenager due  to multiple sports injuries. Recently pain has worsened with decreased ROM as well. PMH significant for RA, Back pain, and OA.   PRECAUTIONS: None  WEIGHT BEARING RESTRICTIONS: No  PAIN:  Are you having pain? Yes: NPRS scale: 3/10 Pain location: trapezius to deltoid Pain description: Aching Aggravating factors: Movement  Relieving factors: medication  FALLS: Has patient fallen in last 6 months? No  PLOF: Independent  PATIENT GOALS: "To reduce pain"  NEXT MD VISIT: None  OBJECTIVE:   HAND DOMINANCE: Right  ADLs: Overall ADLs: Pt has pain with activities such as brushing her hair and teeth. Pt requires compensatory strategies to complete BADL's.   FUNCTIONAL OUTCOME MEASURES: Upper Extremity Functional Scale (UEFS): 47/80 - 58.8%  UPPER EXTREMITY ROM:       Assessed in Seated, er/IR adducted  Active ROM Right eval Right  05/17/23  Shoulder flexion 114 135  Shoulder abduction 113 130  Shoulder internal rotation 90 90  Shoulder external rotation 85 85  (Blank rows = not tested)    UPPER EXTREMITY MMT:     Assessed in seated, er/IR adducted  MMT Right eval Right 05/17/23  Shoulder flexion 3+/5 4/5  Shoulder abduction 3+/5 3+/5  Shoulder internal rotation 4-/5 4-/5  Shoulder external rotation 4-/5 4-/5  (Blank rows = not tested)   OBSERVATIONS: Moderate fascial restrictions along anterior shoulder girdle, as well as deltoid, scapular region, and biceps.  TODAY'S TREATMENT:                                                                                                                              DATE:  05/17/23 -Manual techniques-myofascial release to right upper arm, trapezius, and scapular regions to decrease pain and fascial restrictions and increase joint ROM -P/ROM: supine-flexion, abduction, er, horizontal abduction, 5 reps -Proximal shoulder strengthening: supine-paddles, criss cross, circles each direction, 10 reps - Scapular strength: row,  extension, 12x each red band  -UBE: bike  - Re assessment measurements    05/02/23 -Manual techniques-myofascial release to right upper arm, trapezius, and scapular regions to decrease pain and fascial restrictions and increase joint ROM -P/ROM: supine-flexion, abduction, er, horizontal abduction, 5 reps -A/ROM: supine-protraction, flexion, horizontal abduction, er, abduction, 10 reps -Proximal shoulder strengthening: supine-paddles, criss cross, circles each direction, 10 reps -A/ROM: standing-protraction, flexion, horizontal abduction, er, abduction, 5 reps  03/28/23 -Evaluation -Measurements -Table Slides: flexion, abduction, x10 -Wall Slides: flexion, abduction, x10    PATIENT EDUCATION: Education details: A/ROM Person educated: Patient Education method: Programmer, multimedia, Facilities manager, and Handouts Education comprehension: verbalized understanding and returned demonstration  HOME EXERCISE PROGRAM: 2/6: Table Slides and Wall Slides 3/13: A/ROM  GOALS: Goals reviewed with patient? Yes   SHORT TERM GOALS: Target date: 04/19/23  Pt will be provided with and educated on HEP to improve mobility in RUE required for use during ADL completion.   Goal status: IN PROGRESS   LONG TERM GOALS: Target date: 05/17/23  Pt will decrease pain in RUE to 3/10 or less to improve ability to sleep for 2+ consecutive hours without waking due to pain.   Goal status: IN PROGRESS  2.  Pt will decrease RUE fascial restrictions to min amounts or less to improve mobility required for functional reaching tasks.   Goal status: IN PROGRESS  3.  Pt will increase RUE A/ROM by 30 degrees to improve ability to use RUE when reaching overhead or behind back during dressing and bathing tasks.   Goal status: IN PROGRESS  4.  Pt will increase RUE strength to 4+/5 or greater to improve ability to use RUE when lifting or carrying items during meal preparation/housework/yardwork tasks.   Goal status: IN  PROGRESS  5.  Pt will return to highest level of function using RUE as dominant during functional task completion.   Goal status: IN PROGRESS   ASSESSMENT:  CLINICAL IMPRESSION: Pt completed re assessment this session and new measurements were taken. Pt demonstrates improvements with all movements. Continued proximal strengthening today. Pt would continue to benefit from OT to improve strength and ROM.   PERFORMANCE DEFICITS: in functional skills including in functional skills including ADLs, IADLs, coordination, tone, ROM, strength, pain, fascial restrictions, muscle spasms, and UE functional use.     PLAN:  OT FREQUENCY: 2x/week  OT DURATION: 6 weeks  PLANNED INTERVENTIONS: 97168 OT Re-evaluation, 97535 self care/ADL training, 16109 therapeutic exercise, 97530 therapeutic activity, 97112 neuromuscular  re-education, 97140 manual therapy, 97035 ultrasound, 82956 moist heat, 97032 electrical stimulation (manual), passive range of motion, functional mobility training, energy conservation, coping strategies training, patient/family education, and DME and/or AE instructions  CONSULTED AND AGREED WITH PLAN OF CARE: Patient  PLAN FOR NEXT SESSION: Manual Therapy, P/ROM, AA/ROM, A/ROM, wall slides, isometrics   Lurena Joiner, OTR/L  [phone] 05/17/2023, 3:12 PM

## 2023-05-21 ENCOUNTER — Ambulatory Visit (INDEPENDENT_AMBULATORY_CARE_PROVIDER_SITE_OTHER): Admitting: Obstetrics & Gynecology

## 2023-05-21 ENCOUNTER — Encounter: Payer: Self-pay | Admitting: Obstetrics & Gynecology

## 2023-05-21 VITALS — BP 123/72 | HR 73

## 2023-05-21 DIAGNOSIS — B081 Molluscum contagiosum: Secondary | ICD-10-CM | POA: Diagnosis not present

## 2023-05-21 NOTE — Progress Notes (Signed)
 Chief Complaint  Patient presents with   Vulvar Cyst      68 y.o. G0P0000 No LMP recorded. Patient has had a hysterectomy. The current method of family planning is status post hysterectomy.  Outpatient Encounter Medications as of 05/21/2023  Medication Sig   acetaminophen (TYLENOL) 650 MG CR tablet Take 650 mg by mouth every 8 (eight) hours as needed for pain.   atorvastatin (LIPITOR) 20 MG tablet Take 1 tablet by mouth every evening for cholesterol.   DULoxetine (CYMBALTA) 30 MG capsule Take 30 mg by mouth 2 (two) times daily.   levothyroxine (SYNTHROID) 75 MCG tablet Take 75 mcg by mouth daily.   meloxicam (MOBIC) 7.5 MG tablet Take 7.5 mg by mouth daily.   nicotine (NICODERM CQ - DOSED IN MG/24 HOURS) 21 mg/24hr patch 21 mg daily.   omeprazole (PRILOSEC) 40 MG capsule Take by mouth daily.   tiZANidine (ZANAFLEX) 2 MG tablet Take 2 mg by mouth 2 (two) times daily as needed.   VENTOLIN HFA 108 (90 Base) MCG/ACT inhaler Inhale into the lungs.   [DISCONTINUED] sucralfate (CARAFATE) 1 g tablet Take 1 tablet (1 g total) by mouth 4 (four) times daily - with meals and at bedtime.   No facility-administered encounter medications on file as of 05/21/2023.    Subjective Pt with a couple lesions she wants evaluated  1 has been there many many years The other relatively new Asymptomatic  Past Medical History:  Diagnosis Date   Arthritis    Chickenpox    Chronic bronchitis (HCC)    Depression    GERD (gastroesophageal reflux disease)    IBS (irritable bowel syndrome)    Thyroid disease    1980s, hyperthyroidism, s/p iodine treatment. now hypothyroidism   TIA (transient ischemic attack)    UTI (urinary tract infection)     Past Surgical History:  Procedure Laterality Date   ABDOMINAL HYSTERECTOMY     APPENDECTOMY     BREAST BIOPSY  1992    OB History     Gravida  0   Para  0   Term  0   Preterm  0   AB  0   Living  0      SAB  0   IAB  0   Ectopic  0    Multiple  0   Live Births  0           No Known Allergies  Social History   Socioeconomic History   Marital status: Single    Spouse name: Not on file   Number of children: Not on file   Years of education: Not on file   Highest education level: Not on file  Occupational History   Not on file  Tobacco Use   Smoking status: Every Day   Smokeless tobacco: Never  Substance and Sexual Activity   Alcohol use: Never   Drug use: Not on file   Sexual activity: Not on file  Other Topics Concern   Not on file  Social History Narrative   Divorced.    No children.   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    Enjoys reading, spending time outdoors.    Social Drivers of Corporate investment banker Strain: Not on file  Food Insecurity: Not on file  Transportation Needs: Not on file  Physical Activity: Not on file  Stress: Not on file  Social Connections: Not on file    Family History  Problem  Relation Age of Onset   Arthritis Mother    Cancer Mother        lung   COPD Mother    Depression Mother    Hyperlipidemia Mother    Hypertension Mother    Arthritis Father    Colon cancer Neg Hx     Medications:       Current Outpatient Medications:    acetaminophen (TYLENOL) 650 MG CR tablet, Take 650 mg by mouth every 8 (eight) hours as needed for pain., Disp: , Rfl:    atorvastatin (LIPITOR) 20 MG tablet, Take 1 tablet by mouth every evening for cholesterol., Disp: 30 tablet, Rfl: 0   DULoxetine (CYMBALTA) 30 MG capsule, Take 30 mg by mouth 2 (two) times daily., Disp: , Rfl:    levothyroxine (SYNTHROID) 75 MCG tablet, Take 75 mcg by mouth daily., Disp: , Rfl:    meloxicam (MOBIC) 7.5 MG tablet, Take 7.5 mg by mouth daily., Disp: , Rfl:    nicotine (NICODERM CQ - DOSED IN MG/24 HOURS) 21 mg/24hr patch, 21 mg daily., Disp: , Rfl:    omeprazole (PRILOSEC) 40 MG capsule, Take by mouth daily., Disp: , Rfl:    tiZANidine (ZANAFLEX) 2 MG tablet, Take 2 mg by mouth 2 (two) times  daily as needed., Disp: , Rfl:    VENTOLIN HFA 108 (90 Base) MCG/ACT inhaler, Inhale into the lungs., Disp: , Rfl:    sucralfate (CARAFATE) 1 g tablet, Take 1 tablet (1 g total) by mouth 4 (four) times daily - with meals and at bedtime., Disp: 120 tablet, Rfl: 0  Objective Blood pressure 123/72, pulse 73.  She has 1 sebacesous cyst in the crurolabial crease and 2 molloscum contagiosum lesions All benign no further follow up needed  Pertinent ROS   Labs or studies     Impression + Management Plan: Diagnoses this Encounter::   ICD-10-CM   1. Molluscum contagiosum, vulva  B08.1         Medications prescribed during  this encounter: No orders of the defined types were placed in this encounter.   Labs or Scans Ordered during this encounter: No orders of the defined types were placed in this encounter.     Follow up Return if symptoms worsen or fail to improve.

## 2023-05-22 ENCOUNTER — Other Ambulatory Visit: Payer: Self-pay | Admitting: Gastroenterology

## 2023-05-24 ENCOUNTER — Encounter (HOSPITAL_COMMUNITY): Payer: Self-pay | Admitting: Occupational Therapy

## 2023-05-24 ENCOUNTER — Ambulatory Visit (HOSPITAL_COMMUNITY): Attending: Orthopedic Surgery | Admitting: Occupational Therapy

## 2023-05-24 DIAGNOSIS — R29898 Other symptoms and signs involving the musculoskeletal system: Secondary | ICD-10-CM | POA: Diagnosis not present

## 2023-05-24 DIAGNOSIS — M25611 Stiffness of right shoulder, not elsewhere classified: Secondary | ICD-10-CM | POA: Insufficient documentation

## 2023-05-24 DIAGNOSIS — M25511 Pain in right shoulder: Secondary | ICD-10-CM | POA: Insufficient documentation

## 2023-05-24 DIAGNOSIS — G8929 Other chronic pain: Secondary | ICD-10-CM | POA: Insufficient documentation

## 2023-05-24 NOTE — Therapy (Addendum)
 OUTPATIENT OCCUPATIONAL THERAPY ORTHO  TREATMENT  Patient Name: Melinda Mathis MRN: [id number] DOB:03/19/1955, 68 y.o., female Today's Date: 05/24/2023   END OF SESSION:  OT End of Session - 05/24/23 1341     Visit Number 4    Number of Visits 12    Date for OT Re-Evaluation 06/14/23    Authorization Type Medicare Part A and B    OT Start Time 1303    OT Stop Time 1344    OT Time Calculation (min) 41 min    Activity Tolerance Patient tolerated treatment well    Behavior During Therapy WFL for tasks assessed/performed                Past Medical History:  Diagnosis Date   Arthritis    Chickenpox    Chronic bronchitis (HCC)    Depression    GERD (gastroesophageal reflux disease)    IBS (irritable bowel syndrome)    Thyroid  disease    1980s, hyperthyroidism, s/p iodine treatment. now hypothyroidism   TIA (transient ischemic attack)    UTI (urinary tract infection)    Past Surgical History:  Procedure Laterality Date   ABDOMINAL HYSTERECTOMY     APPENDECTOMY     BREAST BIOPSY  1992   Patient Active Problem List   Diagnosis Date Noted   GERD (gastroesophageal reflux disease) 03/25/2023   Abdominal pain, epigastric 03/25/2023   Colon cancer screening 03/25/2023   Hypothyroidism 07/01/2017   Hyperlipidemia 07/01/2017   Chronic back pain 07/01/2017   Anxiety and depression 07/01/2017   Osteoarthritis 07/01/2017    PCP: Carlette Benita Area, MD REFERRING PROVIDER: Margrette Bars, MD  ONSET DATE: ~1 month  REFERRING DIAG: M25.511,G89.29 (ICD-10-CM) - Chronic right shoulder pain   THERAPY DIAG:  Chronic right shoulder pain  Shoulder stiffness, right  Other symptoms and signs involving the musculoskeletal system  Rationale for Evaluation and Treatment: Rehabilitation  SUBJECTIVE:   SUBJECTIVE STATEMENT: S: My vertigo is bad today.  PERTINENT HISTORY: Pt reports having chronic shoulder pain since she was a teenager due to multiple sports  injuries. Recently pain has worsened with decreased ROM as well. PMH significant for RA, Back pain, and OA.   PRECAUTIONS: None  WEIGHT BEARING RESTRICTIONS: No  PAIN:  Are you having pain? Yes: NPRS scale: 3/10 Pain location: trapezius to deltoid Pain description: Aching Aggravating factors: Movement  Relieving factors: medication  FALLS: Has patient fallen in last 6 months? No  PLOF: Independent  PATIENT GOALS: To reduce pain  NEXT MD VISIT: None  OBJECTIVE:   HAND DOMINANCE: Right  ADLs: Overall ADLs: Pt has pain with activities such as brushing her hair and teeth. Pt requires compensatory strategies to complete BADL's.   FUNCTIONAL OUTCOME MEASURES: Upper Extremity Functional Scale (UEFS): 47/80 - 58.8%  UPPER EXTREMITY ROM:       Assessed in Seated, er/IR adducted  Active ROM Right eval Right  05/17/23  Shoulder flexion 114 135  Shoulder abduction 113 130  Shoulder internal rotation 90 90  Shoulder external rotation 85 85  (Blank rows = not tested)    UPPER EXTREMITY MMT:     Assessed in seated, er/IR adducted  MMT Right eval Right 05/17/23  Shoulder flexion 3+/5 4/5  Shoulder abduction 3+/5 3+/5  Shoulder internal rotation 4-/5 4-/5  Shoulder external rotation 4-/5 4-/5  (Blank rows = not tested)   OBSERVATIONS: Moderate fascial restrictions along anterior shoulder girdle, as well as deltoid, scapular region, and biceps.    TODAY'S TREATMENT:  DATE:  05/24/23 -Manual techniques-myofascial release to right upper arm, trapezius, and scapular regions to decrease pain and fascial restrictions and increase joint ROM -A/ROM: standing-protraction, flexion, horizontal abduction, er, abduction, 5 reps -UE strength: bicep curls, hammer curls, shoulder press, flexion abduction, horizontal abduction punches 10x each  -Proximal shoulder  strengthening: supine-paddles, criss cross, circles each direction, 10 reps -UBE: bike level 1 6 KPH 2 mins forwards       05/17/23 -Manual techniques-myofascial release to right upper arm, trapezius, and scapular regions to decrease pain and fascial restrictions and increase joint ROM -P/ROM: supine-flexion, abduction, er, horizontal abduction, 5 reps -A/ROM: standing-protraction, flexion, horizontal abduction, er, abduction, 5 reps -Proximal shoulder strengthening: supine-paddles, criss cross, circles each direction, 10 reps - Scapular strength: row, extension, 12x each red band  -UBE: bike level 1 6 KPH 2 mins forwards   - Re assessment measurements    05/02/23 -Manual techniques-myofascial release to right upper arm, trapezius, and scapular regions to decrease pain and fascial restrictions and increase joint ROM -P/ROM: supine-flexion, abduction, er, horizontal abduction, 5 reps -A/ROM: supine-protraction, flexion, horizontal abduction, er, abduction, 10 reps -Proximal shoulder strengthening: supine-paddles, criss cross, circles each direction, 10 reps -A/ROM: standing-protraction, flexion, horizontal abduction, er, abduction, 5 reps   PATIENT EDUCATION: Education details: A/ROM Person educated: Patient Education method: Programmer, multimedia, Facilities manager, and Handouts Education comprehension: verbalized understanding and returned demonstration  HOME EXERCISE PROGRAM: 2/6: Table Slides and Wall Slides 3/13: A/ROM  GOALS: Goals reviewed with patient? Yes   SHORT TERM GOALS: Target date: 04/19/23  Pt will be provided with and educated on HEP to improve mobility in RUE required for use during ADL completion.   Goal status: IN PROGRESS   LONG TERM GOALS: Target date: 05/17/23  Pt will decrease pain in RUE to 3/10 or less to improve ability to sleep for 2+ consecutive hours without waking due to pain.   Goal status: IN PROGRESS  2.  Pt will decrease RUE fascial restrictions to  min amounts or less to improve mobility required for functional reaching tasks.   Goal status: IN PROGRESS  3.  Pt will increase RUE A/ROM by 30 degrees to improve ability to use RUE when reaching overhead or behind back during dressing and bathing tasks.   Goal status: IN PROGRESS  4.  Pt will increase RUE strength to 4+/5 or greater to improve ability to use RUE when lifting or carrying items during meal preparation/housework/yardwork tasks.   Goal status: IN PROGRESS  5.  Pt will return to highest level of function using RUE as dominant during functional task completion.   Goal status: IN PROGRESS   ASSESSMENT:  CLINICAL IMPRESSION: Pt continues to demonstrate full ROM. Added upper extremity strength with 2 pound dumb bells- rest breaks given as needed. Completed shoulder stability exercises. Ended on UE bike for endurance.   PERFORMANCE DEFICITS: in functional skills including in functional skills including ADLs, IADLs, coordination, tone, ROM, strength, pain, fascial restrictions, muscle spasms, and UE functional use.     PLAN:  OT FREQUENCY: 2x/week  OT DURATION: 6 weeks  PLANNED INTERVENTIONS: 97168 OT Re-evaluation, 97535 self care/ADL training, 02889 therapeutic exercise, 97530 therapeutic activity, 97112 neuromuscular re-education, 97140 manual therapy, 97035 ultrasound, 97010 moist heat, 97032 electrical stimulation (manual), passive range of motion, functional mobility training, energy conservation, coping strategies training, patient/family education, and DME and/or AE instructions  CONSULTED AND AGREED WITH PLAN OF CARE: Patient  PLAN FOR NEXT SESSION: Manual Therapy, P/ROM, AA/ROM, A/ROM, wall slides, isometrics  Chiquita Sermon, OTR/L  [phone] 05/24/2023, 1:42 PM   [employer] Auth Request  Referring diagnosis code (ICD 10)? M25.511,G89.29 (ICD-10-CM) - Chronic right shoulder pain  Treatment diagnosis codes (ICD 10)? (if different than referring diagnosis)  M25.511, G89.29, M25.611, R29.898 What was this (referring dx) caused by? []  Surgery []  Fall [x]  Ongoing issue []  Arthritis []  Other: ____________  Laterality: [x]  Rt []  Lt []  Both  Deficits: [x]  Pain [x]  Stiffness [x]  Weakness []  Edema []  Balance Deficits []  Coordination []  Gait Disturbance [x]  ROM []  Other   Functional Tool Score: UEFS: 58.8%  CPT codes: See Planned Interventions listed in the Plan section of the Evaluation.

## 2023-05-31 ENCOUNTER — Encounter (HOSPITAL_COMMUNITY): Admitting: Occupational Therapy

## 2023-06-11 ENCOUNTER — Telehealth: Payer: Self-pay

## 2023-06-13 ENCOUNTER — Ambulatory Visit: Admitting: Orthopedic Surgery

## 2023-06-14 ENCOUNTER — Other Ambulatory Visit: Payer: Self-pay | Admitting: Gastroenterology

## 2023-06-17 DIAGNOSIS — K219 Gastro-esophageal reflux disease without esophagitis: Secondary | ICD-10-CM | POA: Diagnosis not present

## 2023-06-17 DIAGNOSIS — E039 Hypothyroidism, unspecified: Secondary | ICD-10-CM | POA: Diagnosis not present

## 2023-06-17 NOTE — Telephone Encounter (Signed)
 Melinda Mathis

## 2023-06-17 NOTE — Telephone Encounter (Signed)
 [name]

## 2023-06-17 NOTE — Telephone Encounter (Signed)
 Patient never scheduled her egd/tcs in 03/2023. Can we contact her regarding this? If she does not plan to have them done, then please schedule return ov.

## 2023-06-18 NOTE — Telephone Encounter (Signed)
 Lmom for pt to return call

## 2023-06-19 NOTE — Telephone Encounter (Signed)
 Lmom for pt to return call

## 2023-06-20 NOTE — Telephone Encounter (Signed)
 Pt stated that she has been in the processing of moving and has been busy with that. Pt states that she does plan on getting them done but not at this time. Pt would not schedule follow up visit, stated that she would call back once she sees what her schedule is like.

## 2023-07-09 ENCOUNTER — Other Ambulatory Visit: Payer: Self-pay

## 2023-07-09 ENCOUNTER — Emergency Department
Admission: EM | Admit: 2023-07-09 | Discharge: 2023-07-10 | Disposition: A | Discharge status: Other Acute Inpatient Hospital | Attending: Emergency Medicine | Admitting: Emergency Medicine

## 2023-07-09 DIAGNOSIS — F419 Anxiety disorder, unspecified: Secondary | ICD-10-CM | POA: Insufficient documentation

## 2023-07-09 DIAGNOSIS — Z79899 Other long term (current) drug therapy: Secondary | ICD-10-CM | POA: Insufficient documentation

## 2023-07-09 DIAGNOSIS — F29 Unspecified psychosis not due to a substance or known physiological condition: Secondary | ICD-10-CM | POA: Diagnosis not present

## 2023-07-09 DIAGNOSIS — F23 Brief psychotic disorder: Secondary | ICD-10-CM | POA: Diagnosis not present

## 2023-07-09 DIAGNOSIS — R443 Hallucinations, unspecified: Secondary | ICD-10-CM | POA: Diagnosis present

## 2023-07-09 DIAGNOSIS — N3000 Acute cystitis without hematuria: Secondary | ICD-10-CM | POA: Diagnosis not present

## 2023-07-09 DIAGNOSIS — R8271 Bacteriuria: Secondary | ICD-10-CM | POA: Insufficient documentation

## 2023-07-09 LAB — COMPREHENSIVE METABOLIC PANEL WITH GFR
ALT: 20 U/L (ref 0–44)
AST: 43 U/L — ABNORMAL HIGH (ref 15–41)
Albumin: 4.3 g/dL (ref 3.5–5.0)
Alkaline Phosphatase: 78 U/L (ref 38–126)
Anion gap: 12 (ref 5–15)
BUN: 19 mg/dL (ref 8–23)
CO2: 26 mmol/L (ref 22–32)
Calcium: 9.7 mg/dL (ref 8.9–10.3)
Chloride: 103 mmol/L (ref 98–111)
Creatinine, Ser: 0.83 mg/dL (ref 0.44–1.00)
GFR, Estimated: >60 mL/min (ref >60)
Glucose, Bld: 100 mg/dL — ABNORMAL HIGH (ref 70–99)
Potassium: 4.1 mmol/L (ref 3.5–5.1)
Sodium: 141 mmol/L (ref 135–145)
Total Bilirubin: 0.5 mg/dL (ref 0.0–1.2)
Total Protein: 7.4 g/dL (ref 6.5–8.1)

## 2023-07-09 LAB — CBC
HCT: 30.7 % — ABNORMAL LOW (ref 36.0–46.0)
Hemoglobin: 10.5 g/dL — ABNORMAL LOW (ref 12.0–15.0)
MCH: 29.5 pg (ref 26.0–34.0)
MCHC: 34.2 g/dL (ref 30.0–36.0)
MCV: 86.2 fL (ref 80.0–100.0)
Platelets: 354 10*3/uL (ref 150–400)
RBC: 3.56 MIL/uL — ABNORMAL LOW (ref 3.87–5.11)
RDW: 14.4 % (ref 11.5–15.5)
WBC: 11.4 10*3/uL — ABNORMAL HIGH (ref 4.0–10.5)
nRBC: 0 % (ref 0.0–0.2)

## 2023-07-09 LAB — TSH: TSH: 1.53 u[IU]/mL (ref 0.350–4.500)

## 2023-07-09 LAB — ETHANOL: Alcohol, Ethyl (B): <15 mg/dL (ref <15)

## 2023-07-09 MED ORDER — MELATONIN 5 MG PO TABS
5.0000 mg | ORAL_TABLET | Freq: Once | ORAL | Status: AC
  Administered 2023-07-09: 5 mg via ORAL
  Filled 2023-07-09: qty 1

## 2023-07-09 MED ORDER — ACETAMINOPHEN 500 MG PO TABS
1000.0000 mg | ORAL_TABLET | Freq: Once | ORAL | Status: AC
  Administered 2023-07-10: 1000 mg via ORAL
  Filled 2023-07-09: qty 2

## 2023-07-09 NOTE — ED Notes (Addendum)
 Pt dressed out by this EDT and Tracey, EDT Pt belongings include: 5 gold rings Glasses Blue lighter Ear rings Blue gown Tan tank top Watch

## 2023-07-09 NOTE — ED Provider Notes (Signed)
 [facility] Provider Note    Event Date/Time   First MD Initiated Contact with Patient 07/09/23 2134     (approximate)   History   Psychiatric Evaluation   HPI Melinda Mathis is a 68 y.o. female with history of anxiety and depression presenting today under IVC.  Patient was found walking around property near heavy traffic hallucinating about cats and Mickey Mouse.  She thought the apartment management was the [facility].  Has been rambling and not making any sense.  Denies SI or HI.  Denies hallucinations.  Fully alert and oriented and denies any other symptoms.     Physical Exam   Triage Vital Signs: ED Triage Vitals  Encounter Vitals Group     BP 07/09/23 2038 (!) 119/98     Systolic BP Percentile --      Diastolic BP Percentile --      Pulse Rate 07/09/23 2038 (!) 112     Resp 07/09/23 2038 18     Temp 07/09/23 2038 98.7 F (37.1 C)     Temp Source 07/09/23 2038 Oral     SpO2 07/09/23 2038 97 %     Weight --      Height --      Head Circumference --      Peak Flow --      Pain Score 07/09/23 2043 0     Pain Loc --      Pain Education --      Exclude from Growth Chart --     Most recent vital signs: Vitals:   07/09/23 2038  BP: (!) 119/98  Pulse: (!) 112  Resp: 18  Temp: 98.7 F (37.1 C)  SpO2: 97%   I have reviewed the vital signs. General:  Awake, alert, no acute distress. Head:  Normocephalic, Atraumatic. EENT:  PERRL, EOMI, Oral mucosa pink and moist, Neck is supple. Cardiovascular: Regular rate, 2+ distal pulses. Respiratory:  Normal respiratory effort, symmetrical expansion, no distress.   Extremities:  Moving all four extremities through full ROM without pain.   Neuro:  Alert and oriented x 4.  Interacting appropriately.   Skin:  Warm, dry, no rash.   Psych: Rambling while speaking with her with pressured speech.   ED Results / Procedures / Treatments   Labs (all labs ordered are listed, but only abnormal results are  displayed) Labs Reviewed  COMPREHENSIVE METABOLIC PANEL WITH GFR - Abnormal; Notable for the following components:      Result Value   Glucose, Bld 100 (*)    AST 43 (*)    All other components within normal limits  CBC - Abnormal; Notable for the following components:   WBC 11.4 (*)    RBC 3.56 (*)    Hemoglobin 10.5 (*)    HCT 30.7 (*)    All other components within normal limits  ETHANOL  TSH  URINE DRUG SCREEN, QUALITATIVE ([facility] ONLY)  URINALYSIS, ROUTINE W REFLEX MICROSCOPIC  T3, FREE     EKG    RADIOLOGY    PROCEDURES:  Critical Care performed: No  Procedures   MEDICATIONS ORDERED IN ED: Medications  acetaminophen  (TYLENOL ) tablet 1,000 mg (has no administration in time range)  melatonin tablet 5 mg (5 mg Oral Given 07/09/23 2346)     IMPRESSION / MDM / ASSESSMENT AND PLAN / ED COURSE  I reviewed the triage vital signs and the nursing notes.  Differential diagnosis includes, but is not limited to, acute psychosis, schizophrenia  Patient's presentation is most consistent with acute complicated illness / injury requiring diagnostic workup.  Patient is a 68 year old female presenting today under IVC for psychiatric evaluation.  Has been having hallucinations and concerns for psychosis.  She is rambling when I speak with her but otherwise appears fully alert and oriented.  Denies any acute symptoms.  Does appear to have pressured speech concerning for psychosis for his mania.  History of anxiety and depression in the past.  Physical exam unremarkable.  Laboratory workup with CBC, CMP, ethanol largely reassuring.  Will consult psychiatry for further evaluation.  Patient signed out at end of shift pending psychiatric evaluation.  The patient has been placed in psychiatric observation due to the need to provide a safe environment for the patient while obtaining psychiatric consultation and evaluation, as well as ongoing medical and  medication management to treat the patient's condition.  The patient has been placed under full IVC at this time.     FINAL CLINICAL IMPRESSION(S) / ED DIAGNOSES   Final diagnoses:  Psychosis, unspecified psychosis type (HCC)     Rx / DC Orders   ED Discharge Orders     None        Note:  This document was prepared using Dragon voice recognition software and may include unintentional dictation errors.   Kandee Orion, MD 07/10/23 [phone]

## 2023-07-09 NOTE — ED Triage Notes (Signed)
 Pt to ED involuntarily via [employer] for psych eval. Per IVC paperwork, pt was walking around property near heavy traffic, hallucinating cats and mickey mouse. Pt also thought the apartment management was the [employer]. Pt rambling in triage and not making any sense. Denies SI/HI.

## 2023-07-09 NOTE — Group Note (Signed)
 Date:  07/09/2023 Time:  9:48 PM  Group Topic/Focus:  Wrap-Up Group:   The focus of this group is to help patients review their daily goal of treatment and discuss progress on daily workbooks.    Participation Level:  Did Not Attend   Melinda Mathis 07/09/2023, 9:48 PM

## 2023-07-10 DIAGNOSIS — Z8659 Personal history of other mental and behavioral disorders: Secondary | ICD-10-CM | POA: Diagnosis not present

## 2023-07-10 DIAGNOSIS — Z79899 Other long term (current) drug therapy: Secondary | ICD-10-CM | POA: Diagnosis not present

## 2023-07-10 DIAGNOSIS — K589 Irritable bowel syndrome without diarrhea: Secondary | ICD-10-CM | POA: Diagnosis not present

## 2023-07-10 DIAGNOSIS — E785 Hyperlipidemia, unspecified: Secondary | ICD-10-CM | POA: Diagnosis not present

## 2023-07-10 DIAGNOSIS — N3000 Acute cystitis without hematuria: Secondary | ICD-10-CM | POA: Diagnosis not present

## 2023-07-10 DIAGNOSIS — N390 Urinary tract infection, site not specified: Secondary | ICD-10-CM | POA: Diagnosis not present

## 2023-07-10 DIAGNOSIS — E039 Hypothyroidism, unspecified: Secondary | ICD-10-CM | POA: Diagnosis not present

## 2023-07-10 DIAGNOSIS — F1290 Cannabis use, unspecified, uncomplicated: Secondary | ICD-10-CM | POA: Diagnosis not present

## 2023-07-10 DIAGNOSIS — F29 Unspecified psychosis not due to a substance or known physiological condition: Secondary | ICD-10-CM | POA: Diagnosis not present

## 2023-07-10 DIAGNOSIS — R8271 Bacteriuria: Secondary | ICD-10-CM | POA: Diagnosis not present

## 2023-07-10 DIAGNOSIS — F312 Bipolar disorder, current episode manic severe with psychotic features: Secondary | ICD-10-CM | POA: Diagnosis not present

## 2023-07-10 DIAGNOSIS — F419 Anxiety disorder, unspecified: Secondary | ICD-10-CM | POA: Diagnosis not present

## 2023-07-10 DIAGNOSIS — Z5901 Sheltered homelessness: Secondary | ICD-10-CM | POA: Diagnosis not present

## 2023-07-10 DIAGNOSIS — K219 Gastro-esophageal reflux disease without esophagitis: Secondary | ICD-10-CM | POA: Diagnosis not present

## 2023-07-10 DIAGNOSIS — F0630 Mood disorder due to known physiological condition, unspecified: Secondary | ICD-10-CM | POA: Diagnosis not present

## 2023-07-10 DIAGNOSIS — F39 Unspecified mood [affective] disorder: Secondary | ICD-10-CM | POA: Diagnosis not present

## 2023-07-10 DIAGNOSIS — R2243 Localized swelling, mass and lump, lower limb, bilateral: Secondary | ICD-10-CM | POA: Diagnosis not present

## 2023-07-10 DIAGNOSIS — J45909 Unspecified asthma, uncomplicated: Secondary | ICD-10-CM | POA: Diagnosis not present

## 2023-07-10 DIAGNOSIS — I10 Essential (primary) hypertension: Secondary | ICD-10-CM | POA: Diagnosis not present

## 2023-07-10 LAB — URINALYSIS, ROUTINE W REFLEX MICROSCOPIC
Bilirubin Urine: NEGATIVE
Glucose, UA: NEGATIVE mg/dL
Ketones, ur: NEGATIVE mg/dL
Nitrite: POSITIVE — AB
Protein, ur: NEGATIVE mg/dL
Specific Gravity, Urine: 1.004 — ABNORMAL LOW (ref 1.005–1.030)
Squamous Epithelial / HPF: 0 /HPF (ref 0–5)
pH: 5 (ref 5.0–8.0)

## 2023-07-10 LAB — URINE DRUG SCREEN, QUALITATIVE (ARMC ONLY)
Amphetamines, Ur Screen: NOT DETECTED
Barbiturates, Ur Screen: NOT DETECTED
Benzodiazepine, Ur Scrn: NOT DETECTED
Cannabinoid 50 Ng, Ur ~~LOC~~: POSITIVE — AB
Cocaine Metabolite,Ur ~~LOC~~: NOT DETECTED
MDMA (Ecstasy)Ur Screen: NOT DETECTED
Methadone Scn, Ur: NOT DETECTED
Opiate, Ur Screen: NOT DETECTED
Phencyclidine (PCP) Ur S: NOT DETECTED
Tricyclic, Ur Screen: NOT DETECTED

## 2023-07-10 MED ORDER — ATORVASTATIN CALCIUM 20 MG PO TABS
20.0000 mg | ORAL_TABLET | Freq: Every day | ORAL | Status: DC
  Administered 2023-07-10: 20 mg via ORAL
  Filled 2023-07-10: qty 1

## 2023-07-10 MED ORDER — HYDROXYZINE HCL 25 MG PO TABS
50.0000 mg | ORAL_TABLET | Freq: Once | ORAL | Status: AC
  Administered 2023-07-10: 50 mg via ORAL
  Filled 2023-07-10: qty 2

## 2023-07-10 MED ORDER — CEPHALEXIN 500 MG PO CAPS
500.0000 mg | ORAL_CAPSULE | Freq: Three times a day (TID) | ORAL | Status: DC
  Administered 2023-07-10 (×2): 500 mg via ORAL
  Filled 2023-07-10 (×3): qty 1

## 2023-07-10 MED ORDER — LEVOTHYROXINE SODIUM 75 MCG PO TABS
75.0000 ug | ORAL_TABLET | Freq: Every day | ORAL | Status: DC
  Administered 2023-07-10: 75 ug via ORAL
  Filled 2023-07-10: qty 2

## 2023-07-10 MED ORDER — PANTOPRAZOLE SODIUM 40 MG PO TBEC
40.0000 mg | DELAYED_RELEASE_TABLET | Freq: Every day | ORAL | Status: DC
Start: 2023-07-10 — End: 2023-07-10
  Administered 2023-07-10: 40 mg via ORAL
  Filled 2023-07-10: qty 1

## 2023-07-10 MED ORDER — DULOXETINE HCL 30 MG PO CPEP
30.0000 mg | ORAL_CAPSULE | Freq: Two times a day (BID) | ORAL | Status: DC
  Administered 2023-07-10: 30 mg via ORAL
  Filled 2023-07-10: qty 1

## 2023-07-10 MED ORDER — LORAZEPAM 2 MG PO TABS
2.0000 mg | ORAL_TABLET | Freq: Once | ORAL | Status: AC
  Administered 2023-07-10: 2 mg via ORAL
  Filled 2023-07-10: qty 1

## 2023-07-10 MED ORDER — SUCRALFATE 1 G PO TABS
1.0000 g | ORAL_TABLET | Freq: Three times a day (TID) | ORAL | Status: DC
Start: 2023-07-10 — End: 2023-07-10
  Administered 2023-07-10 (×2): 1 g via ORAL
  Filled 2023-07-10 (×4): qty 1

## 2023-07-10 MED ORDER — NICOTINE 21 MG/24HR TD PT24
21.0000 mg | MEDICATED_PATCH | Freq: Once | TRANSDERMAL | Status: DC
  Filled 2023-07-10: qty 1

## 2023-07-10 NOTE — ED Provider Notes (Signed)
 Vitals:   07/09/23 2038 07/10/23 0633  BP: (!) 119/98 (!) 141/71  Pulse: (!) 112 75  Resp: 18 19  Temp: 98.7 F (37.1 C) 97.6 F (36.4 C)  SpO2: 97% 97%     Patient is somewhat upset that she has to transfer out of [place] for psychiatric care.  Discussed with the patient that she currently is under involuntary commitment, and she is understanding.  She does not wish to have to transfer outside of [facility], but ultimately is excepting.  She is very upset that she believes her brother placed her under IVC.  She is awake alert ambulatory, stable for transfer for ongoing psychiatric stabilization   Melinda Marker, MD 07/10/23 [phone]

## 2023-07-10 NOTE — ED Notes (Signed)
 Pt. Transferred to [facility] , room# 7 from main ED .Patient was screened by security before entering the unit. Received Report and Recommendations from [place], RN . Pt. Oriented to unit including Q15 minute rounding as well as locked bathroom protocol, meal / snack schedule, and  the security cameras in place for their protection. Patient is A/O x 2, warm / dry.  Showing acute signs of distress, elevated anxiety.  Pt is hyperverbal, with pressured speech  Staff to monitor as ordered

## 2023-07-10 NOTE — ED Notes (Addendum)
 Pt transferred to   [facility] via [facility]. All belongings and transfer paperwork given to [employer]. Pt alert, cooperative, RR even and unlabored, color WNL. Pt in NAD.

## 2023-07-10 NOTE — ED Notes (Signed)
 Pt continues to be restless - unable to rest or close eyes. Pt continues to rapidly talk nonsensically.

## 2023-07-10 NOTE — ED Notes (Signed)
 Pt continues to stand at the doorway of her room and is rambling talking to everyone in the hall. This RN told pt she must stay in the room and try and get some rest - she is interrupting others trying to sleep. This RN informed her that the door must be shut. Blinds remain open, pt visible.

## 2023-07-10 NOTE — Consult Note (Signed)
 [facility] Psychiatric Consult Initial  Patient Name: .Melinda Mathis  MRN: [id number]  DOB: 05-Dec-1955  Consult Order details:  Orders (From admission, onward)     Start     Ordered   07/09/23 2203  CONSULT TO CALL ACT TEAM       Ordering Provider: Kandee Orion, MD  Provider:  (Not yet assigned)  Question:  Reason for Consult?  Answer:  Psych consult   07/09/23 2202   07/09/23 2203  IP CONSULT TO PSYCHIATRY       Ordering Provider: Kandee Orion, MD  Provider:  (Not yet assigned)  Question Answer Comment  Place call to: psych   Reason for Consult Admit   Diagnosis/Clinical Info for Consult: hallucinations, paranoia, acute psychosis      07/09/23 2202             Mode of Visit: Tele-visit Virtual Statement:TELE PSYCHIATRY ATTESTATION & CONSENT As the provider for this telehealth consult, I attest that I verified the patient's identity using two separate identifiers, introduced myself to the patient, provided my credentials, disclosed my location, and performed this encounter via a HIPAA-compliant, real-time, face-to-face, two-way, interactive audio and video platform and with the full consent and agreement of the patient (or guardian as applicable.) Patient physical location: [facility]. Telehealth provider physical location: home office in state of  .   Video start time:   Video end time:      Psychiatry Consult Evaluation  Service Date: Jul 10, 2023 LOS:  LOS: 0 days  Chief Complaint Hallucinations  Primary Psychiatric Diagnoses  MDD with psychotic features  Assessment  Melinda Mathis is a 68 y.o. female admitted: Presented to the ED for 07/09/2023  9:25 PM  for wandering behavior and disorganized thoughts with visual hallucinations. She carries the psychiatric diagnoses major depressive disorder with psychotic features and has a past medical history of GERD.  Her current presentation of disorganized behavior and hallucinations is  most consistent with an acute exacerbation of MDD with psychotic features. She meets criteria for inpatient psychiatric admission under IVC based on disorganized thought process, inability to care for herself, and wandering behavior.  Current outpatient psychotropic medications include Duloxetine and historically she has had a partial response to these medications. She was non-compliant with medications prior to admission as evidenced by disorganized behavior and inability to answer questions coherently during assessment.  On initial examination, patient demonstrated circumstantial and pressured speech, was difficult to redirect, and was unable to provide coherent responses to many questions.  Diagnoses:  Active Hospital problems: Active Problems:   * No active hospital problems. *    Plan   ## Psychiatric Medication Recommendations:  zyprexa  ## Medical Decision Making Capacity: Not specifically addressed in this encounter   ## Disposition:-- We recommend inpatient psychiatric hospitalization after medical hospitalization. Patient has been involuntarily committed on 07/09/23.   ## Behavioral / Environmental: - No specific recommendations at this time.     ## Safety and Observation Level:  - Based on my clinical evaluation, I estimate the patient to be at low risk of self harm in the current setting. - At this time, we recommend  routine. This decision is based on my review of the chart including patient's history and current presentation, interview of the patient, mental status examination, and consideration of suicide risk including evaluating suicidal ideation, plan, intent, suicidal or self-harm behaviors, risk factors, and protective factors. This judgment is based on our ability to directly  address suicide risk, implement suicide prevention strategies, and develop a safety plan while the patient is in the clinical setting. Please contact our team if there is a concern that risk level  has changed.  CSSR Risk Category:C-SSRS RISK CATEGORY: No Risk  Suicide Risk Assessment: Patient has following modifiable risk factors for suicide: untreated depression, which we are addressing by inpatient admission. Patient has following non-modifiable or demographic risk factors for suicide: separation or divorce Patient has the following protective factors against suicide: Supportive friends  Thank you for this consult request. Recommendations have been communicated to the primary team.  We will recommend inpatient admission at this time.   Jamai Dolce, NP       History of Present Illness  Relevant Aspects of Hospital ED Course:  Admitted on 07/09/2023 under  IVC.  Patient Report:  The patient is a 68 year old female presenting under IVC for wandering and experiencing hallucinations involving cats and Mickey Mouse. She reports living alone. The patient denied suicidal ideation (SI), homicidal ideation (HI), and auditory/visual hallucinations (AVH) during assessment, despite reported behaviors suggesting otherwise.  She was difficult to engage due to pressured and circumstantial speech. She became easily irritated when attempts were made to redirect her.  Psych ROS:  Depression: yes Anxiety:  no Mania (lifetime and current): no Psychosis: (lifetime and current): yes  Review of Systems  Constitutional: Negative.   HENT: Negative.    Eyes: Negative.   Respiratory: Negative.    Cardiovascular: Negative.   Gastrointestinal: Negative.   Genitourinary: Negative.   Musculoskeletal: Negative.   Skin: Negative.   Neurological: Negative.      Psychiatric and Social History  Psychiatric History:  Information collected from Patients chart  Prev Dx/Sx: Depression Current Psych Provider: unknown Home Meds (current): Duloxetine Previous Med Trials: unknown Therapy: unknown  Prior Psych Hospitalization: unknown  Prior Self Harm: unknown Prior Violence: unknown  Family  Psych History: unknown Family Hx suicide: unknown  Social History:  Developmental Hx: unknown Educational Hx: unknown Occupational Hx: unknown Legal Hx: unknown Living Situation: lives alone Spiritual Hx: unknown Access to weapons/lethal means: no   Substance History Alcohol : unknown  Type of alcohol  unknown Last Drink unknown Number of drinks per day unknown History of alcohol  withdrawal seizures unknown History of DT's unknown Tobacco: unknown Illicit drugs: unknown Prescription drug abuse: unknown Rehab hx: unknown  Exam Findings   Vital Signs:  Temp:  [98.7 F (37.1 C)] 98.7 F (37.1 C) (05/20 2038) Pulse Rate:  [112] 112 (05/20 2038) Resp:  [18] 18 (05/20 2038) BP: (119)/(98) 119/98 (05/20 2038) SpO2:  [97 %] 97 % (05/20 2038) Blood pressure (!) 119/98, pulse (!) 112, temperature 98.7 F (37.1 C), temperature source Oral, resp. rate 18, SpO2 97%. There is no height or weight on file to calculate BMI.  Physical Exam HENT:     Head: Normocephalic.     Nose: Nose normal.  Eyes:     Extraocular Movements: Extraocular movements intact.  Pulmonary:     Effort: Pulmonary effort is normal.  Musculoskeletal:        General: Normal range of motion.     Cervical back: Normal range of motion.  Skin:    General: Skin is dry.  Neurological:     Mental Status: She is alert.     Other History   These have been pulled in through the EMR, reviewed, and updated if appropriate.  Family History:  The patient's family history includes Arthritis in her father and mother;  COPD in her mother; Cancer in her mother; Depression in her mother; Hyperlipidemia in her mother; Hypertension in her mother.  Medical History: Past Medical History:  Diagnosis Date   Arthritis    Chickenpox    Chronic bronchitis (HCC)    Depression    GERD (gastroesophageal reflux disease)    IBS (irritable bowel syndrome)    Thyroid  disease    1980s, hyperthyroidism, s/p iodine treatment. now  hypothyroidism   TIA (transient ischemic attack)    UTI (urinary tract infection)     Surgical History: Past Surgical History:  Procedure Laterality Date   ABDOMINAL HYSTERECTOMY     APPENDECTOMY     BREAST BIOPSY  1992     Medications:   Current Facility-Administered Medications:    cephALEXin (KEFLEX) capsule 500 mg, 500 mg, Oral, Q8H, Sung, Jade J, MD  Current Outpatient Medications:    acetaminophen  (TYLENOL ) 650 MG CR tablet, Take 650 mg by mouth every 8 (eight) hours as needed for pain., Disp: , Rfl:    atorvastatin  (LIPITOR) 20 MG tablet, Take 1 tablet by mouth every evening for cholesterol., Disp: 30 tablet, Rfl: 0   DULoxetine (CYMBALTA) 30 MG capsule, Take 30 mg by mouth 2 (two) times daily., Disp: , Rfl:    levothyroxine  (SYNTHROID ) 75 MCG tablet, Take 75 mcg by mouth daily., Disp: , Rfl:    meloxicam (MOBIC) 7.5 MG tablet, Take 7.5 mg by mouth daily., Disp: , Rfl:    nicotine (NICODERM CQ - DOSED IN MG/24 HOURS) 21 mg/24hr patch, 21 mg daily., Disp: , Rfl:    omeprazole (PRILOSEC) 40 MG capsule, Take by mouth daily., Disp: , Rfl:    sucralfate  (CARAFATE ) 1 g tablet, Take 1 tablet (1 g total) by mouth 4 (four) times daily - with meals and at bedtime., Disp: 120 tablet, Rfl: 0   tiZANidine (ZANAFLEX) 2 MG tablet, Take 2 mg by mouth 2 (two) times daily as needed., Disp: , Rfl:    VENTOLIN HFA 108 (90 Base) MCG/ACT inhaler, Inhale into the lungs., Disp: , Rfl:   Allergies: No Known Allergies  Marquerite Forsman, NP

## 2023-07-10 NOTE — ED Notes (Signed)
 EMTALA reviewed by this RN, pt ready for transfer, pt under IVC, no transfer consent obtained.

## 2023-07-10 NOTE — ED Notes (Signed)
 Lunch provided.

## 2023-07-10 NOTE — ED Provider Notes (Signed)
 Emergency Medicine Observation Re-evaluation Note  Melinda Mathis is a 68 y.o. female, seen on rounds today.  Pt initially presented to the ED for complaints of Psychiatric Evaluation Currently, the patient is resting, voices no medical complaints.  Physical Exam  BP (!) 119/98 (BP Location: Left Arm)   Pulse (!) 112   Temp 98.7 F (37.1 C) (Oral)   Resp 18   SpO2 97%  Physical Exam General: Resting in no acute distress Cardiac: No cyanosis Lungs: Equal rise and fall Psych: Not agitated  ED Course / MDM  EKG:   I have reviewed the labs performed to date as well as medications administered while in observation.  Recent changes in the last 24 hours include no events overnight.  Awaiting pharmacy tech to reconcile medications so home medications can be ordered..  Plan  Current plan is for transfer to [facility] later this morning..    Melinda Mathis J, MD 07/10/23 [phone]

## 2023-07-10 NOTE — BH Assessment (Signed)
 Comprehensive Clinical Assessment (CCA) Note  07/10/2023 Melinda Mathis [id number]  Chief Complaint: Patient is a 68 year old female presenting to [facility] ED under IVC. Per triage note Pt to ED involuntarily via BPD for psych eval. Per IVC paperwork, pt was walking around property near heavy traffic, hallucinating cats and mickey mouse. Pt also thought the apartment management was the FBI. Pt rambling in triage and not making any sense. Denies SI/HI.  During assessment patient appears alert and oriented x2, cooperative but disorganized with delusions and with tangential speech. When asked if the patient understands why she is presenting to the ED she reports "I drank my coffee out the wrong side of my mouth, the property I live on has a 8 foot snake, it was black on the top, I don't like a snake, on the property I live on I could live with my feet resting" "a tree limb, I was born under a tree." Patient is difficult to redirect but is unable to explain why she is in the ED. Patient is able to report that she lives along, has good sleep and a good appetite. Patient is also able to deny current SI/HI/AH/VH although patient could be responding to some internal stimuli as she would point to something in the room that didn't appear to be there.   Per Psyc NP Shelvy Dickens McLauchlin patient is recommended for Inpatient  Chief Complaint  Patient presents with   Psychiatric Evaluation   Visit Diagnosis: Acute Psychosis    CCA Screening, Triage and Referral (STR)  Patient Reported Information How did you hear about us ? Legal System  Referral name: No data recorded Referral phone number: No data recorded  Whom do you see for routine medical problems? No data recorded Practice/Facility Name: No data recorded Practice/Facility Phone Number: No data recorded Name of Contact: No data recorded Contact Number: No data recorded Contact Fax Number: No data recorded Prescriber Name: No data recorded Prescriber  Address (if known): No data recorded  What Is the Reason for Your Visit/Call Today? Pt to ED involuntarily via BPD for psych eval. Per IVC paperwork, pt was walking around property near heavy traffic, hallucinating cats and mickey mouse. Pt also thought the apartment management was the FBI. Pt rambling in triage and not making any sense. Denies SI/HI  How Long Has This Been Causing You Problems? > than 6 months  What Do You Feel Would Help You the Most Today? No data recorded  Have You Recently Been in Any Inpatient Treatment (Hospital/Detox/Crisis Center/28-Day Program)? No data recorded Name/Location of Program/Hospital:No data recorded How Long Were You There? No data recorded When Were You Discharged? No data recorded  Have You Ever Received Services From [facility] Before? No data recorded Who Do You See at [facility]? No data recorded  Have You Recently Had Any Thoughts About Hurting Yourself? No  Are You Planning to Commit Suicide/Harm Yourself At This time? No   Have you Recently Had Thoughts About Hurting Someone Marigene Shoulder? No  Explanation: No data recorded  Have You Used Any Alcohol  or Drugs in the Past 24 Hours? No  How Long Ago Did You Use Drugs or Alcohol ? No data recorded What Did You Use and How Much? No data recorded  Do You Currently Have a Therapist/Psychiatrist? No  Name of Therapist/Psychiatrist: No data recorded  Have You Been Recently Discharged From Any Office Practice or Programs? No  Explanation of Discharge From Practice/Program: No data recorded    CCA Screening Triage  Referral Assessment Type of Contact: Face-to-Face  Is this Initial or Reassessment? No data recorded Date Telepsych consult ordered in [facility]:  No data recorded Time Telepsych consult ordered in [facility]:  No data recorded  Patient Reported Information Reviewed? No data recorded Patient Left Without Being Seen? No data recorded Reason for Not Completing Assessment: No data  recorded  Collateral Involvement: No data recorded  Does Patient Have a Court Appointed Legal Guardian? No data recorded Name and Contact of Legal Guardian: No data recorded If Minor and Not Living with Parent(s), Who has Custody? No data recorded Is CPS involved or ever been involved? Never  Is APS involved or ever been involved? Never   Patient Determined To Be At Risk for Harm To Self or Others Based on Review of Patient Reported Information or Presenting Complaint? No  Method: No data recorded Availability of Means: No data recorded Intent: No data recorded Notification Required: No data recorded Additional Information for Danger to Others Potential: No data recorded Additional Comments for Danger to Others Potential: No data recorded Are There Guns or Other Weapons in Your Home? No  Types of Guns/Weapons: No data recorded Are These Weapons Safely Secured?                            No data recorded Who Could Verify You Are Able To Have These Secured: No data recorded Do You Have any Outstanding Charges, Pending Court Dates, Parole/Probation? No data recorded Contacted To Inform of Risk of Harm To Self or Others: No data recorded  Location of Assessment: [facility] ED   Does Patient Present under Involuntary Commitment? Yes  IVC Papers Initial File Date: No data recorded  [place] of Residence: [place]   Patient Currently Receiving the Following Services: No data recorded  Determination of Need: Emergent (2 hours)   Options For Referral: No data recorded    CCA Biopsychosocial Intake/Chief Complaint:  No data recorded Current Symptoms/Problems: No data recorded  Patient Reported Schizophrenia/Schizoaffective Diagnosis in Past: No   Strengths: Patient is able to communicate her needs  Preferences: No data recorded Abilities: No data recorded  Type of Services Patient Feels are Needed: No data recorded  Initial Clinical Notes/Concerns: No data  recorded  Mental Health Symptoms Depression:  Irritability   Duration of Depressive symptoms: Greater than two weeks   Mania:  Change in energy/activity; Euphoria; Increased Energy; Irritability; Racing thoughts; Recklessness   Anxiety:   Difficulty concentrating; Irritability; Restlessness; Worrying   Psychosis:  Delusions; Grossly disorganized or catatonic behavior   Duration of Psychotic symptoms: Less than six months   Trauma:  None   Obsessions:  Poor insight; Recurrent & persistent thoughts/impulses/images   Compulsions:  Absent insight/delusional; Poor Insight; Repeated behaviors/mental acts; "Driven" to perform behaviors/acts   Inattention:  None   Hyperactivity/Impulsivity:  None   Oppositional/Defiant Behaviors:  None   Emotional Irregularity:  Intense/inappropriate anger   Other Mood/Personality Symptoms:  No data recorded   Mental Status Exam Appearance and self-care  Stature:  Average   Weight:  Average weight   Clothing:  Casual   Grooming:  Normal   Cosmetic use:  None   Posture/gait:  Normal   Motor activity:  Not Remarkable   Sensorium  Attention:  Distractible   Concentration:  Anxiety interferes; Focuses on irrelevancies; Scattered   Orientation:  Person; Place   Recall/memory:  Defective in Short-term; Defective in Recent   Affect and Mood  Affect:  Anxious; Labile   Mood:  Anxious; Hypomania   Relating  Eye contact:  Normal   Facial expression:  Responsive   Attitude toward examiner:  Cooperative   Thought and Language  Speech flow: Flight of Ideas   Thought content:  Delusions   Preoccupation:  Ruminations; Obsessions   Hallucinations:  None   Organization:  No data recorded  [employer] of Knowledge:  Fair   Intelligence:  Average   Abstraction:  Abstract   Judgement:  Impaired; Poor   Reality Testing:  Distorted   Insight:  None/zero insight; Poor   Decision Making:  Impulsive   Social  Functioning  Social Maturity:  Impulsive   Social Judgement:  Heedless   Stress  Stressors:  Illness   Coping Ability:  Contractor Deficits:  None   Supports:  Support needed     Religion: Religion/Spirituality Are You A Religious Person?: No  Leisure/Recreation: Leisure / Recreation Do You Have Hobbies?: No  Exercise/Diet: Exercise/Diet Do You Exercise?: No Have You Gained or Lost A Significant Amount of Weight in the Past Six Months?: No Do You Follow a Special Diet?: No Do You Have Any Trouble Sleeping?: No   CCA Employment/Education Employment/Work Situation: Employment / Work Situation Employment Situation: Unemployed Has Patient ever Been in Equities trader?: No  Education: Education Is Patient Currently Attending School?: No Did You Have An Individualized Education Program (IIEP): No Did You Have Any Difficulty At [employer]?: No Patient's Education Has Been Impacted by Current Illness: No   CCA Family/Childhood History Family and Relationship History: Family history Marital status: Single  Childhood History:  Childhood History Did patient suffer any verbal/emotional/physical/sexual abuse as a child?: No Did patient suffer from severe childhood neglect?: No Has patient ever been sexually abused/assaulted/raped as an adolescent or adult?: No Was the patient ever a victim of a crime or a disaster?: No Witnessed domestic violence?: No Has patient been affected by domestic violence as an adult?: No  Child/Adolescent Assessment:     CCA Substance Use Alcohol /Drug Use: Alcohol  / Drug Use Pain Medications: see mar Prescriptions: see mar Over the Counter: see mar History of alcohol  / drug use?: No history of alcohol  / drug abuse                         ASAM's:  Six Dimensions of Multidimensional Assessment  Dimension 1:  Acute Intoxication and/or Withdrawal Potential:      Dimension 2:  Biomedical Conditions and Complications:       Dimension 3:  Emotional, Behavioral, or Cognitive Conditions and Complications:     Dimension 4:  Readiness to Change:     Dimension 5:  Relapse, Continued use, or Continued Problem Potential:     Dimension 6:  Recovery/Living Environment:     ASAM Severity Score:    ASAM Recommended Level of Treatment:     Substance use Disorder (SUD)    Recommendations for Services/Supports/Treatments:    DSM5 Diagnoses: Patient Active Problem List   Diagnosis Date Noted   GERD (gastroesophageal reflux disease) 03/25/2023   Abdominal pain, epigastric 03/25/2023   Colon cancer screening 03/25/2023   Hypothyroidism 07/01/2017   Hyperlipidemia 07/01/2017   Chronic back pain 07/01/2017   Anxiety and depression 07/01/2017   Osteoarthritis 07/01/2017    Patient Centered Plan: Patient is on the following Treatment Plan(s):  Impulse Control   Referrals to Alternative Service(s): Referred to Alternative Service(s):   Place:  Date:   Time:    Referred to Alternative Service(s):   Place:   Date:   Time:    Referred to Alternative Service(s):   Place:   Date:   Time:    Referred to Alternative Service(s):   Place:   Date:   Time:      @BHCOLLABOFCARE @  [employer], LCAS-A

## 2023-07-10 NOTE — ED Notes (Signed)
 Ivc /patient accepted to [facility] /accepting physician is Dr.Christina Fita call report to [phone] rep was Bria

## 2023-07-10 NOTE — BH Assessment (Signed)
 PATIENT BED AVAILABLE AFTER 7AM ON 07/10/23  Patient has been accepted to [facility].  Accepting physician is Dr. Osvaldo Blender.  Call report to [phone].  Representative was Bria.   ER Staff is aware of it:  Carlene ER Secretary  Dr. Vallery Gavel, ER MD  [facility] Patient's Nurse

## 2023-07-10 NOTE — ED Notes (Signed)
 Pt is very anxious, sensitive, and tearful with interactions.

## 2023-07-10 NOTE — BH Assessment (Signed)
 Per [facility] Burdette Carolin), patient to be referred out of system.  Referral information for Psychiatric Hospitalization faxed to;   [facility] [phone]  [facility] Patient Placement [phone]  [facility] [phone]  [facility] [phone]  [facility] [phone]  [facility] [phone]  [facility] [phone]  [facility] [phone]  [facility] [phone]  [facility] [phone]  [facility] [phone]  [facility] [phone]  [facility] [phone]  [place] EFAX [phone]  [facility] Behavior[facility] [phone]  [facility] [phone]

## 2023-07-11 DIAGNOSIS — E039 Hypothyroidism, unspecified: Secondary | ICD-10-CM | POA: Diagnosis not present

## 2023-07-11 DIAGNOSIS — J45909 Unspecified asthma, uncomplicated: Secondary | ICD-10-CM | POA: Diagnosis not present

## 2023-07-11 DIAGNOSIS — I10 Essential (primary) hypertension: Secondary | ICD-10-CM | POA: Diagnosis not present

## 2023-07-11 DIAGNOSIS — F29 Unspecified psychosis not due to a substance or known physiological condition: Secondary | ICD-10-CM | POA: Diagnosis not present

## 2023-07-11 DIAGNOSIS — K219 Gastro-esophageal reflux disease without esophagitis: Secondary | ICD-10-CM | POA: Diagnosis not present

## 2023-07-11 DIAGNOSIS — N3000 Acute cystitis without hematuria: Secondary | ICD-10-CM | POA: Diagnosis not present

## 2023-07-11 LAB — T3, FREE: T3, Free: 3 pg/mL (ref 2.0–4.4)

## 2023-07-12 DIAGNOSIS — Z8659 Personal history of other mental and behavioral disorders: Secondary | ICD-10-CM | POA: Diagnosis not present

## 2023-07-12 DIAGNOSIS — F39 Unspecified mood [affective] disorder: Secondary | ICD-10-CM | POA: Diagnosis not present

## 2023-07-12 LAB — URINE CULTURE: Culture: >=100000 — AB

## 2023-07-13 DIAGNOSIS — Z8659 Personal history of other mental and behavioral disorders: Secondary | ICD-10-CM | POA: Diagnosis not present

## 2023-07-13 DIAGNOSIS — F39 Unspecified mood [affective] disorder: Secondary | ICD-10-CM | POA: Diagnosis not present

## 2023-07-13 DIAGNOSIS — E039 Hypothyroidism, unspecified: Secondary | ICD-10-CM | POA: Diagnosis not present

## 2023-07-13 DIAGNOSIS — N3000 Acute cystitis without hematuria: Secondary | ICD-10-CM | POA: Diagnosis not present

## 2023-07-13 NOTE — Progress Notes (Signed)
 ED Antimicrobial Stewardship Positive Culture Follow Up   Melinda Mathis is an 68 y.o. female who presented to [facility] on 07/09/2023 with a chief complaint of  Chief Complaint  Patient presents with   Psychiatric Evaluation    Recent Results (from the past 720 hours)  Urine Culture     Status: Abnormal   Collection Time: 07/10/23  4:40 AM   Specimen: Urine, Clean Catch  Result Value Ref Range Status   Specimen Description   Final    URINE, CLEAN CATCH Performed at [facility], [address] [place], [place] [address]    Special Requests   Final    NONE Performed at [facility], [address] [place], [place] [address]    Culture >=100,000 COLONIES/mL ESCHERICHIA COLI (A)  Final   Report Status 07/12/2023 FINAL  Final   Organism ID, Bacteria ESCHERICHIA COLI (A)  Final      Susceptibility   Escherichia coli - MIC*    AMPICILLIN <=2 SENSITIVE Sensitive     CEFAZOLIN <=4 SENSITIVE Sensitive     CEFEPIME <=0.12 SENSITIVE Sensitive     CEFTRIAXONE <=0.25 SENSITIVE Sensitive     CIPROFLOXACIN <=0.25 SENSITIVE Sensitive     GENTAMICIN <=1 SENSITIVE Sensitive     IMIPENEM <=0.25 SENSITIVE Sensitive     NITROFURANTOIN <=16 SENSITIVE Sensitive     TRIMETH/SULFA <=20 SENSITIVE Sensitive     AMPICILLIN/SULBACTAM <=2 SENSITIVE Sensitive     PIP/TAZO <=4 SENSITIVE Sensitive ug/mL    * >=100,000 COLONIES/mL ESCHERICHIA COLI    Follow up with receiving facility concerning Urine culture  Patient transferred to [facility]- [phone] Called and spoke with RN on [place] unit. Patient is currently on Nitrofurantoin per RN. Will fax copy of Urine culture for their records Fax [phone]  Attn: Carmine Christ PharmD Clinical Pharmacist 07/13/2023

## 2023-07-14 DIAGNOSIS — Z8659 Personal history of other mental and behavioral disorders: Secondary | ICD-10-CM | POA: Diagnosis not present

## 2023-07-14 DIAGNOSIS — F39 Unspecified mood [affective] disorder: Secondary | ICD-10-CM | POA: Diagnosis not present

## 2023-07-15 DIAGNOSIS — F39 Unspecified mood [affective] disorder: Secondary | ICD-10-CM | POA: Diagnosis not present

## 2023-07-15 DIAGNOSIS — Z8659 Personal history of other mental and behavioral disorders: Secondary | ICD-10-CM | POA: Diagnosis not present

## 2023-07-16 ENCOUNTER — Other Ambulatory Visit: Payer: Self-pay | Admitting: Gastroenterology

## 2023-07-16 DIAGNOSIS — Z8659 Personal history of other mental and behavioral disorders: Secondary | ICD-10-CM | POA: Diagnosis not present

## 2023-07-16 DIAGNOSIS — F39 Unspecified mood [affective] disorder: Secondary | ICD-10-CM | POA: Diagnosis not present

## 2023-07-16 NOTE — Telephone Encounter (Signed)
 No refills for carafate . She has not completed labs or egd/tcs. We have not been able to reach her.  She was recently under involuntary commitment.   Needs ov in 6 weeks.

## 2023-07-19 DIAGNOSIS — K219 Gastro-esophageal reflux disease without esophagitis: Secondary | ICD-10-CM | POA: Diagnosis not present

## 2023-07-19 DIAGNOSIS — E039 Hypothyroidism, unspecified: Secondary | ICD-10-CM | POA: Diagnosis not present

## 2023-07-20 DIAGNOSIS — F39 Unspecified mood [affective] disorder: Secondary | ICD-10-CM | POA: Diagnosis not present

## 2023-07-20 DIAGNOSIS — R2243 Localized swelling, mass and lump, lower limb, bilateral: Secondary | ICD-10-CM | POA: Diagnosis not present

## 2023-07-20 DIAGNOSIS — Z8659 Personal history of other mental and behavioral disorders: Secondary | ICD-10-CM | POA: Diagnosis not present

## 2023-07-20 DIAGNOSIS — F29 Unspecified psychosis not due to a substance or known physiological condition: Secondary | ICD-10-CM | POA: Diagnosis not present

## 2023-07-21 DIAGNOSIS — F39 Unspecified mood [affective] disorder: Secondary | ICD-10-CM | POA: Diagnosis not present

## 2023-07-21 DIAGNOSIS — Z8659 Personal history of other mental and behavioral disorders: Secondary | ICD-10-CM | POA: Diagnosis not present

## 2023-07-23 ENCOUNTER — Encounter: Payer: Self-pay | Admitting: Gastroenterology

## 2023-07-28 DIAGNOSIS — D649 Anemia, unspecified: Secondary | ICD-10-CM | POA: Diagnosis not present

## 2023-07-28 DIAGNOSIS — E079 Disorder of thyroid, unspecified: Secondary | ICD-10-CM | POA: Diagnosis not present

## 2023-07-28 DIAGNOSIS — I509 Heart failure, unspecified: Secondary | ICD-10-CM | POA: Diagnosis not present

## 2023-07-28 DIAGNOSIS — W19XXXA Unspecified fall, initial encounter: Secondary | ICD-10-CM | POA: Diagnosis not present

## 2023-07-28 DIAGNOSIS — S098XXA Other specified injuries of head, initial encounter: Secondary | ICD-10-CM | POA: Diagnosis not present

## 2023-07-28 DIAGNOSIS — C8590 Non-Hodgkin lymphoma, unspecified, unspecified site: Secondary | ICD-10-CM | POA: Diagnosis not present

## 2023-07-28 DIAGNOSIS — K219 Gastro-esophageal reflux disease without esophagitis: Secondary | ICD-10-CM | POA: Diagnosis not present

## 2023-07-28 DIAGNOSIS — R42 Dizziness and giddiness: Secondary | ICD-10-CM | POA: Diagnosis not present

## 2023-07-28 DIAGNOSIS — R110 Nausea: Secondary | ICD-10-CM | POA: Diagnosis not present

## 2023-07-28 DIAGNOSIS — R55 Syncope and collapse: Secondary | ICD-10-CM | POA: Diagnosis not present

## 2023-07-28 DIAGNOSIS — S0990XA Unspecified injury of head, initial encounter: Secondary | ICD-10-CM | POA: Diagnosis not present

## 2023-07-28 DIAGNOSIS — F17210 Nicotine dependence, cigarettes, uncomplicated: Secondary | ICD-10-CM | POA: Diagnosis not present

## 2023-07-31 NOTE — Telephone Encounter (Signed)
Could not reach pt to schedule appt.

## 2023-08-11 DIAGNOSIS — R110 Nausea: Secondary | ICD-10-CM | POA: Diagnosis not present

## 2023-08-11 DIAGNOSIS — R55 Syncope and collapse: Secondary | ICD-10-CM | POA: Diagnosis not present

## 2023-08-11 DIAGNOSIS — R112 Nausea with vomiting, unspecified: Secondary | ICD-10-CM | POA: Diagnosis not present

## 2023-08-11 DIAGNOSIS — E860 Dehydration: Secondary | ICD-10-CM | POA: Diagnosis not present

## 2023-08-11 DIAGNOSIS — D649 Anemia, unspecified: Secondary | ICD-10-CM | POA: Diagnosis not present

## 2023-08-11 DIAGNOSIS — I491 Atrial premature depolarization: Secondary | ICD-10-CM | POA: Diagnosis not present

## 2023-08-11 DIAGNOSIS — R42 Dizziness and giddiness: Secondary | ICD-10-CM | POA: Diagnosis not present

## 2023-08-11 DIAGNOSIS — I959 Hypotension, unspecified: Secondary | ICD-10-CM | POA: Diagnosis not present

## 2023-08-11 DIAGNOSIS — R531 Weakness: Secondary | ICD-10-CM | POA: Diagnosis not present

## 2023-08-11 DIAGNOSIS — M069 Rheumatoid arthritis, unspecified: Secondary | ICD-10-CM | POA: Diagnosis not present

## 2023-08-11 DIAGNOSIS — F17210 Nicotine dependence, cigarettes, uncomplicated: Secondary | ICD-10-CM | POA: Diagnosis not present

## 2023-08-11 DIAGNOSIS — I493 Ventricular premature depolarization: Secondary | ICD-10-CM | POA: Diagnosis not present

## 2023-08-19 DIAGNOSIS — I493 Ventricular premature depolarization: Secondary | ICD-10-CM | POA: Diagnosis not present

## 2023-08-19 DIAGNOSIS — Z5900 Homelessness unspecified: Secondary | ICD-10-CM | POA: Diagnosis not present

## 2023-08-19 DIAGNOSIS — R569 Unspecified convulsions: Secondary | ICD-10-CM | POA: Diagnosis not present

## 2023-08-19 DIAGNOSIS — R55 Syncope and collapse: Secondary | ICD-10-CM | POA: Diagnosis not present

## 2023-08-19 DIAGNOSIS — R410 Disorientation, unspecified: Secondary | ICD-10-CM | POA: Diagnosis not present

## 2023-08-19 DIAGNOSIS — R0902 Hypoxemia: Secondary | ICD-10-CM | POA: Diagnosis not present

## 2023-08-19 DIAGNOSIS — W19XXXA Unspecified fall, initial encounter: Secondary | ICD-10-CM | POA: Diagnosis not present

## 2023-09-25 DIAGNOSIS — G43809 Other migraine, not intractable, without status migrainosus: Secondary | ICD-10-CM | POA: Diagnosis not present

## 2023-09-25 DIAGNOSIS — F32A Depression, unspecified: Secondary | ICD-10-CM | POA: Diagnosis not present

## 2023-09-25 DIAGNOSIS — N3000 Acute cystitis without hematuria: Secondary | ICD-10-CM | POA: Diagnosis not present

## 2023-09-25 DIAGNOSIS — K219 Gastro-esophageal reflux disease without esophagitis: Secondary | ICD-10-CM | POA: Diagnosis not present

## 2023-09-25 DIAGNOSIS — M1990 Unspecified osteoarthritis, unspecified site: Secondary | ICD-10-CM | POA: Diagnosis not present

## 2023-09-25 DIAGNOSIS — N3001 Acute cystitis with hematuria: Secondary | ICD-10-CM | POA: Diagnosis not present

## 2023-09-25 DIAGNOSIS — I959 Hypotension, unspecified: Secondary | ICD-10-CM | POA: Diagnosis not present

## 2023-09-25 DIAGNOSIS — E785 Hyperlipidemia, unspecified: Secondary | ICD-10-CM | POA: Diagnosis not present

## 2023-09-25 DIAGNOSIS — E039 Hypothyroidism, unspecified: Secondary | ICD-10-CM | POA: Diagnosis not present

## 2023-09-25 DIAGNOSIS — I951 Orthostatic hypotension: Secondary | ICD-10-CM | POA: Diagnosis not present

## 2023-09-25 DIAGNOSIS — F419 Anxiety disorder, unspecified: Secondary | ICD-10-CM | POA: Diagnosis not present

## 2023-09-25 DIAGNOSIS — R531 Weakness: Secondary | ICD-10-CM | POA: Diagnosis not present

## 2023-09-25 DIAGNOSIS — R55 Syncope and collapse: Secondary | ICD-10-CM | POA: Diagnosis not present

## 2023-09-25 DIAGNOSIS — R059 Cough, unspecified: Secondary | ICD-10-CM | POA: Diagnosis not present

## 2023-09-25 DIAGNOSIS — R42 Dizziness and giddiness: Secondary | ICD-10-CM | POA: Diagnosis not present

## 2023-09-25 DIAGNOSIS — R110 Nausea: Secondary | ICD-10-CM | POA: Diagnosis not present

## 2023-09-26 DIAGNOSIS — I080 Rheumatic disorders of both mitral and aortic valves: Secondary | ICD-10-CM | POA: Diagnosis not present

## 2023-09-26 DIAGNOSIS — R55 Syncope and collapse: Secondary | ICD-10-CM | POA: Diagnosis not present

## 2023-09-26 DIAGNOSIS — I6523 Occlusion and stenosis of bilateral carotid arteries: Secondary | ICD-10-CM | POA: Diagnosis not present

## 2023-09-26 NOTE — Telephone Encounter (Signed)
 Several attempts to contact patient. No response. Letter mailed.

## 2023-09-27 DIAGNOSIS — R42 Dizziness and giddiness: Secondary | ICD-10-CM | POA: Diagnosis not present

## 2023-09-27 DIAGNOSIS — G43809 Other migraine, not intractable, without status migrainosus: Secondary | ICD-10-CM | POA: Diagnosis not present

## 2023-09-27 DIAGNOSIS — R55 Syncope and collapse: Secondary | ICD-10-CM | POA: Diagnosis not present

## 2023-09-28 DIAGNOSIS — R55 Syncope and collapse: Secondary | ICD-10-CM | POA: Diagnosis not present

## 2023-09-29 DIAGNOSIS — J42 Unspecified chronic bronchitis: Secondary | ICD-10-CM | POA: Diagnosis not present

## 2023-09-29 DIAGNOSIS — N390 Urinary tract infection, site not specified: Secondary | ICD-10-CM | POA: Diagnosis not present

## 2023-09-29 DIAGNOSIS — Z79899 Other long term (current) drug therapy: Secondary | ICD-10-CM | POA: Diagnosis not present

## 2023-09-29 DIAGNOSIS — R42 Dizziness and giddiness: Secondary | ICD-10-CM | POA: Diagnosis not present

## 2023-09-29 DIAGNOSIS — R55 Syncope and collapse: Secondary | ICD-10-CM | POA: Diagnosis not present

## 2023-09-29 DIAGNOSIS — R531 Weakness: Secondary | ICD-10-CM | POA: Diagnosis not present

## 2023-09-29 DIAGNOSIS — D509 Iron deficiency anemia, unspecified: Secondary | ICD-10-CM | POA: Diagnosis not present

## 2023-09-29 DIAGNOSIS — Z59811 Housing instability, housed, with risk of homelessness: Secondary | ICD-10-CM | POA: Diagnosis not present

## 2023-09-29 DIAGNOSIS — R110 Nausea: Secondary | ICD-10-CM | POA: Diagnosis not present

## 2023-09-29 DIAGNOSIS — E860 Dehydration: Secondary | ICD-10-CM | POA: Diagnosis not present

## 2023-09-29 DIAGNOSIS — F17200 Nicotine dependence, unspecified, uncomplicated: Secondary | ICD-10-CM | POA: Diagnosis not present

## 2023-10-27 DIAGNOSIS — Z5982 Transportation insecurity: Secondary | ICD-10-CM | POA: Diagnosis not present

## 2023-10-27 DIAGNOSIS — R4182 Altered mental status, unspecified: Secondary | ICD-10-CM | POA: Diagnosis not present

## 2023-10-27 DIAGNOSIS — E039 Hypothyroidism, unspecified: Secondary | ICD-10-CM | POA: Diagnosis not present

## 2023-10-27 DIAGNOSIS — F333 Major depressive disorder, recurrent, severe with psychotic symptoms: Secondary | ICD-10-CM | POA: Diagnosis not present

## 2023-10-27 DIAGNOSIS — F29 Unspecified psychosis not due to a substance or known physiological condition: Secondary | ICD-10-CM | POA: Diagnosis not present

## 2023-10-27 DIAGNOSIS — R42 Dizziness and giddiness: Secondary | ICD-10-CM | POA: Diagnosis not present

## 2023-10-27 DIAGNOSIS — R5383 Other fatigue: Secondary | ICD-10-CM | POA: Diagnosis not present

## 2023-10-27 DIAGNOSIS — Z79890 Hormone replacement therapy (postmenopausal): Secondary | ICD-10-CM | POA: Diagnosis not present

## 2023-10-27 DIAGNOSIS — E785 Hyperlipidemia, unspecified: Secondary | ICD-10-CM | POA: Diagnosis not present

## 2023-10-27 DIAGNOSIS — Z59811 Housing instability, housed, with risk of homelessness: Secondary | ICD-10-CM | POA: Diagnosis not present

## 2023-10-27 DIAGNOSIS — Z5941 Food insecurity: Secondary | ICD-10-CM | POA: Diagnosis not present

## 2023-12-08 DIAGNOSIS — R5383 Other fatigue: Secondary | ICD-10-CM | POA: Diagnosis not present

## 2023-12-08 DIAGNOSIS — Z5900 Homelessness unspecified: Secondary | ICD-10-CM | POA: Diagnosis not present

## 2023-12-08 DIAGNOSIS — K219 Gastro-esophageal reflux disease without esophagitis: Secondary | ICD-10-CM | POA: Diagnosis not present

## 2023-12-08 DIAGNOSIS — R9431 Abnormal electrocardiogram [ECG] [EKG]: Secondary | ICD-10-CM | POA: Diagnosis not present

## 2023-12-08 DIAGNOSIS — R197 Diarrhea, unspecified: Secondary | ICD-10-CM | POA: Diagnosis not present

## 2023-12-08 DIAGNOSIS — R112 Nausea with vomiting, unspecified: Secondary | ICD-10-CM | POA: Diagnosis not present

## 2023-12-08 DIAGNOSIS — E785 Hyperlipidemia, unspecified: Secondary | ICD-10-CM | POA: Diagnosis not present

## 2023-12-08 DIAGNOSIS — N3000 Acute cystitis without hematuria: Secondary | ICD-10-CM | POA: Diagnosis not present

## 2023-12-08 DIAGNOSIS — Z79899 Other long term (current) drug therapy: Secondary | ICD-10-CM | POA: Diagnosis not present

## 2023-12-08 DIAGNOSIS — R531 Weakness: Secondary | ICD-10-CM | POA: Diagnosis not present

## 2023-12-08 DIAGNOSIS — Z79890 Hormone replacement therapy (postmenopausal): Secondary | ICD-10-CM | POA: Diagnosis not present

## 2024-01-07 DIAGNOSIS — N3001 Acute cystitis with hematuria: Secondary | ICD-10-CM | POA: Diagnosis not present

## 2024-01-07 DIAGNOSIS — R55 Syncope and collapse: Secondary | ICD-10-CM | POA: Diagnosis not present

## 2024-01-07 DIAGNOSIS — R531 Weakness: Secondary | ICD-10-CM | POA: Diagnosis not present

## 2024-01-07 DIAGNOSIS — N3080 Other cystitis without hematuria: Secondary | ICD-10-CM | POA: Diagnosis not present

## 2024-01-07 DIAGNOSIS — R7989 Other specified abnormal findings of blood chemistry: Secondary | ICD-10-CM | POA: Diagnosis not present

## 2024-03-23 ENCOUNTER — Telehealth: Payer: Self-pay | Admitting: *Deleted

## 2024-03-23 NOTE — Progress Notes (Unsigned)
 Complex Care Management Note Care Guide Note  03/23/2024 Name: Melinda Mathis MRN: [id number] DOB: 03-May-1955   Complex Care Management Outreach Attempts: An unsuccessful telephone outreach was attempted today to offer the patient information about available complex care management services.  Follow Up Plan:  Additional outreach attempts will be made to offer the patient complex care management information and services.   Encounter Outcome:  No Answer  Harlene Satterfield  [facility]  [facility] Guide  Direct Dial: [phone]  Fax [phone]
